# Patient Record
Sex: Female | Born: 1980 | Race: Asian | Hispanic: No | Marital: Married | Smoking: Never smoker
Health system: Southern US, Community
[De-identification: ages and names within clinical notes are randomized; demographics above are authoritative.]

## PROBLEM LIST (undated history)

## (undated) DIAGNOSIS — M069 Rheumatoid arthritis, unspecified: Secondary | ICD-10-CM

---

## 2010-01-03 ENCOUNTER — Encounter: Admission: RE | Admit: 2010-01-03 | Discharge: 2010-01-03 | Payer: Self-pay | Admitting: Gastroenterology

## 2010-12-17 IMAGING — CT CT ABD-PELV W/ CM
3 of 4 series · 12 of 36 positions shown, 19 images · IV contrast (agent unspecified)
Comparison: None.

CLINICAL DATA: Periumbilical abdominal pain.  Nausea.  Bloating.

CT ABDOMEN AND PELVIS WITH CONTRAST
TECHNIQUE: Multidetector CT imaging of the abdomen and pelvis was
performed following the standard protocol during bolus
administration of intravenous contrast.
Contrast: 100 ml Mmnipaque-6XX and oral contrast

[Series 3: routine abdomen · axial · 0.58mm/px · z∈[-325,-5]mm · 9 of 82 slices shown, 15 images]
[im 9/82  soft-tissue]
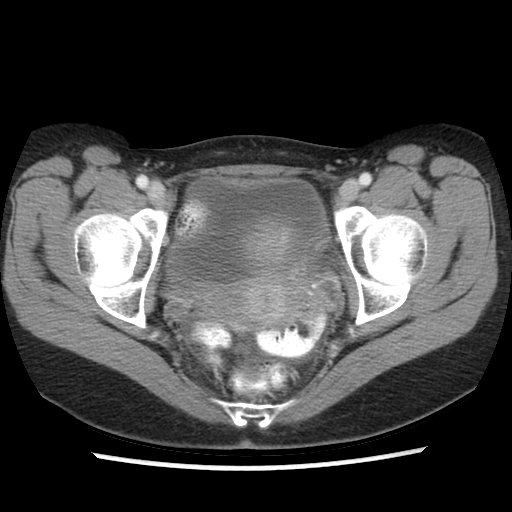
[im 9/82  bone]
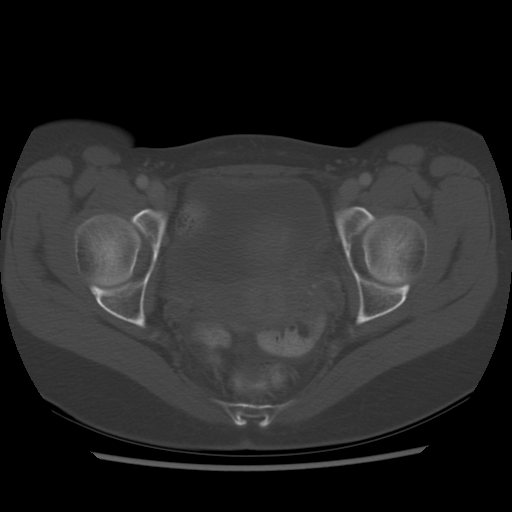
[im 17/82  soft-tissue]
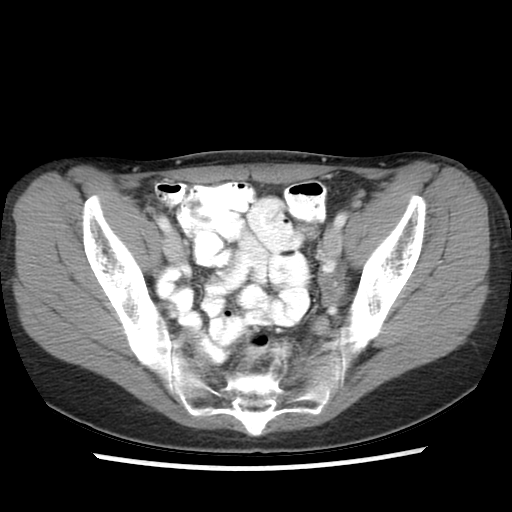
[im 25/82  soft-tissue]
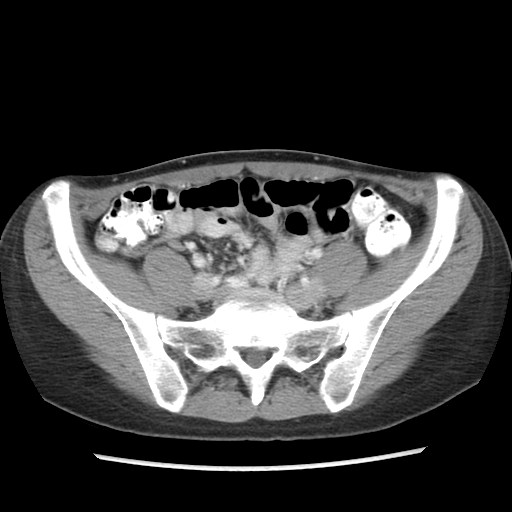
[im 33/82  soft-tissue]
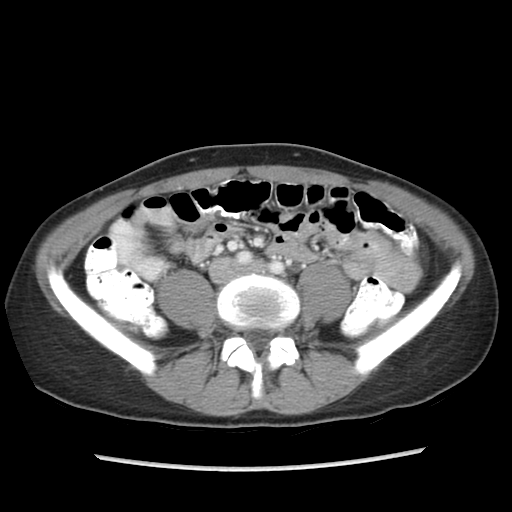
[im 41/82  soft-tissue]
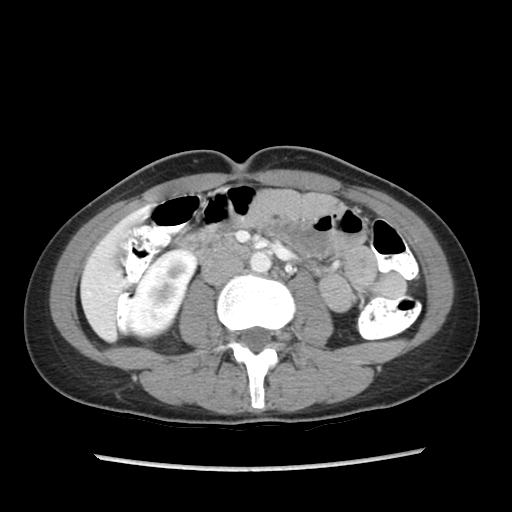
[im 49/82  soft-tissue]
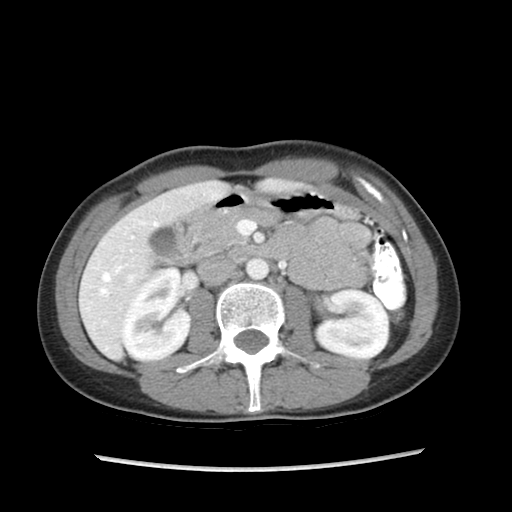
[im 49/82  lung]
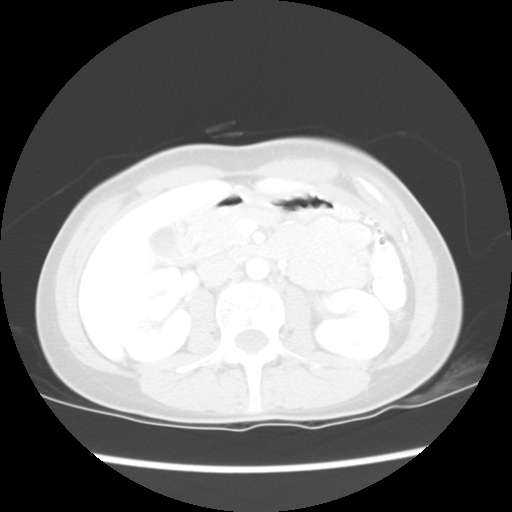
[im 57/82  soft-tissue]
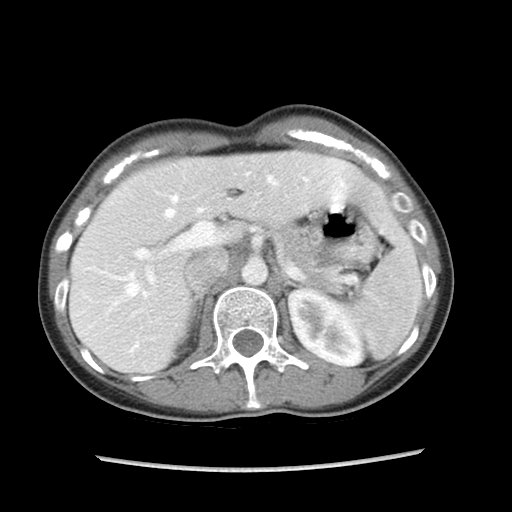
[im 57/82  lung]
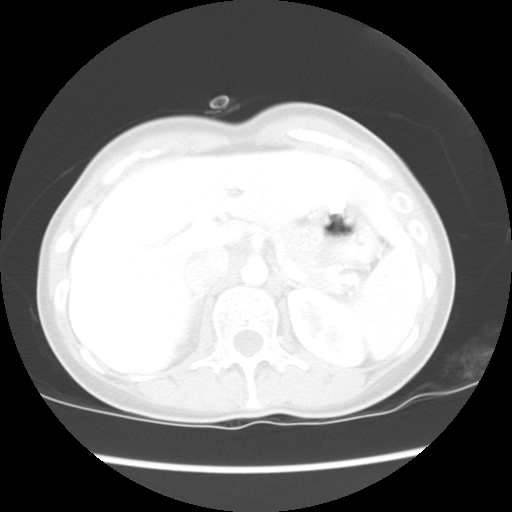
[im 65/82  soft-tissue]
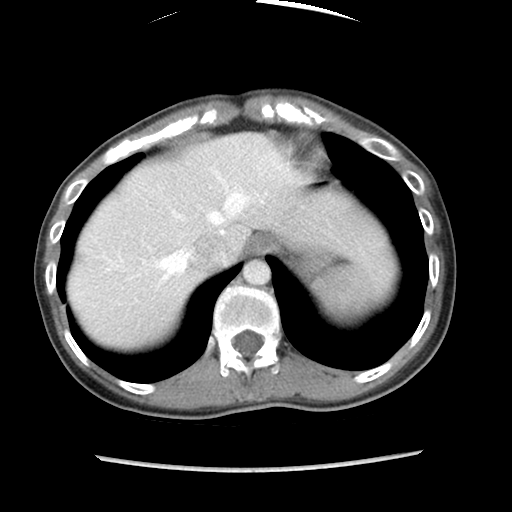
[im 65/82  lung]
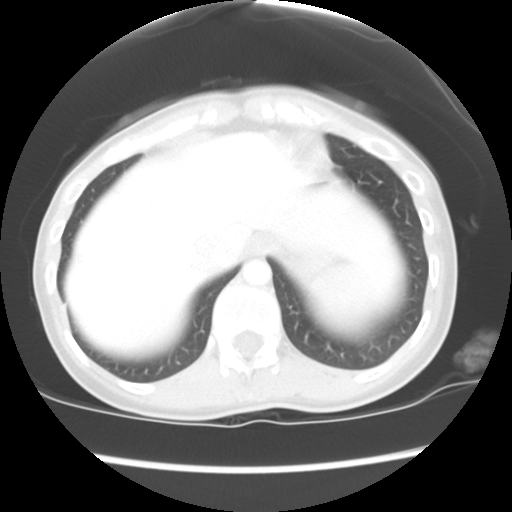
[im 73/82  soft-tissue]
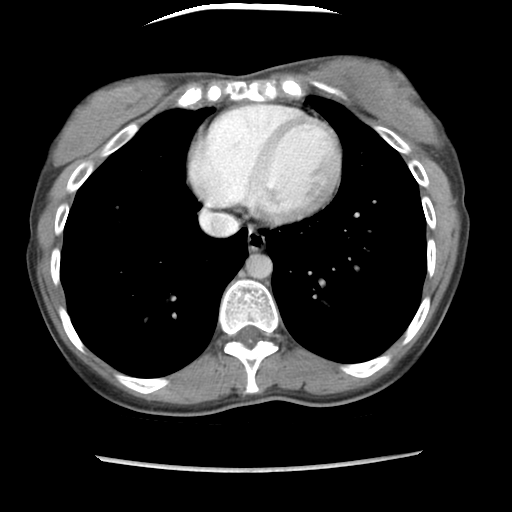
[im 73/82  lung]
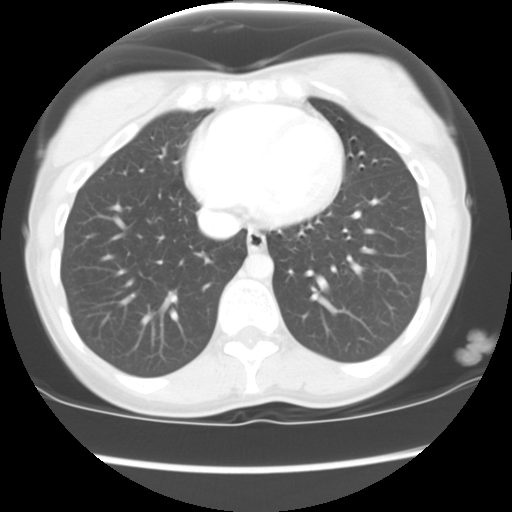
[im 73/82  bone]
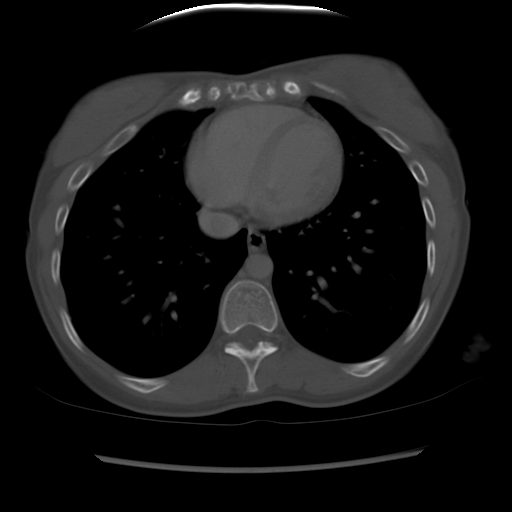

[Series 601: coronal body · coronal · 0.86mm/px · 1 of 108 slices shown, 2 images]
[im 36/108  soft-tissue]
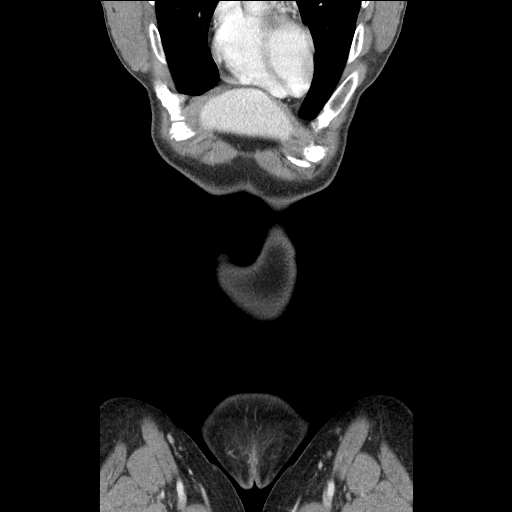
[im 36/108  bone]
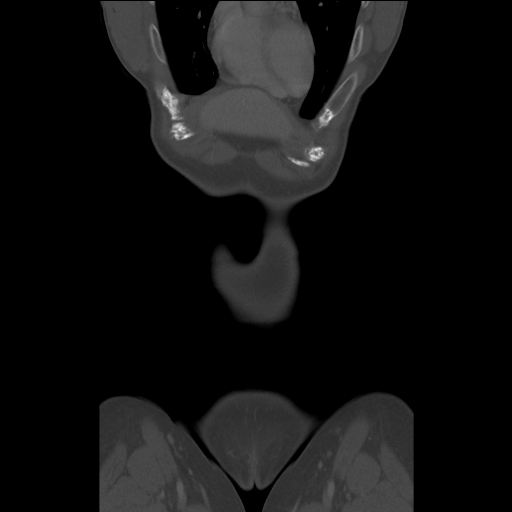

[Series 602: sagittal body · sagittal · 0.86mm/px · 2 of 121 slices shown]
[im 8/121  soft-tissue]
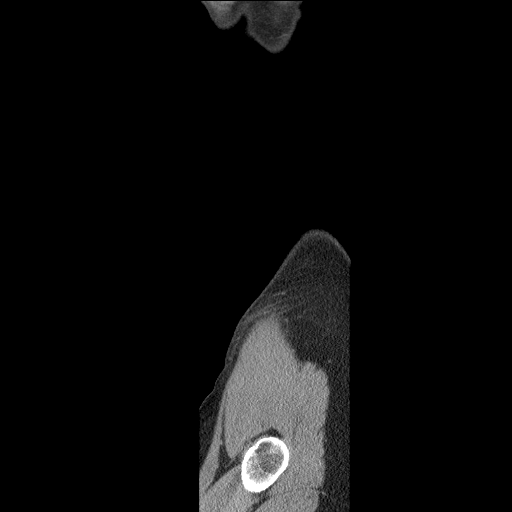
[im 23/121  soft-tissue]
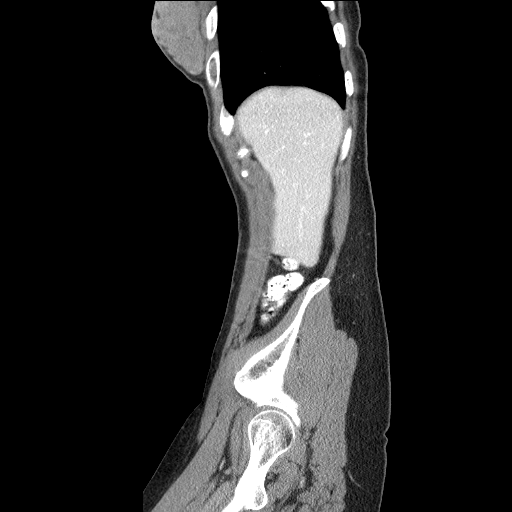

[12 of 36 positions shown; findings below may reference images not displayed]

FINDINGS: The abdominal parenchymal organs are normal in
appearance.  Gallbladder appears normal, and there is no evidence
of hydronephrosis.

No soft tissue masses or lymphadenopathy are identified within the
abdomen or pelvis.  Uterus and adnexa are unremarkable in
appearance.  Incidental note is made of a 1.2 cm follicular cyst in
the left ovary.

There is no evidence of inflammatory process or abnormal fluid
collections within the abdomen or pelvis.  No evidence of bowel
wall thickening or dilatation.  No hernia identified.
IMPRESSION: Negative CT of the abdomen and pelvis.

## 2011-04-05 ENCOUNTER — Inpatient Hospital Stay (HOSPITAL_COMMUNITY)
Admission: AD | Admit: 2011-04-05 | Discharge: 2011-04-05 | Disposition: A | Discharge status: Home / Self Care | Payer: BC Managed Care – PPO | Source: Ambulatory Visit | Attending: Obstetrics and Gynecology | Admitting: Obstetrics and Gynecology

## 2011-04-05 DIAGNOSIS — Z3480 Encounter for supervision of other normal pregnancy, unspecified trimester: Secondary | ICD-10-CM | POA: Insufficient documentation

## 2011-04-05 MED ORDER — GENTAMICIN SULFATE 40 MG/ML IJ SOLN
80.0000 mg | Freq: Once | INTRAMUSCULAR | Status: AC
  Administered 2011-04-05: 80 mg via INTRAMUSCULAR

## 2011-04-07 LAB — RPR: RPR: NONREACTIVE

## 2011-04-07 LAB — ABO/RH

## 2011-04-07 LAB — HIV ANTIBODY (ROUTINE TESTING W REFLEX): HIV: NONREACTIVE

## 2011-04-07 LAB — ANTIBODY SCREEN: Antibody Screen: NEGATIVE

## 2011-10-19 ENCOUNTER — Encounter (HOSPITAL_COMMUNITY): Payer: Self-pay | Admitting: *Deleted

## 2011-10-19 ENCOUNTER — Encounter (HOSPITAL_COMMUNITY): Payer: Self-pay | Admitting: Anesthesiology

## 2011-10-19 ENCOUNTER — Inpatient Hospital Stay (HOSPITAL_COMMUNITY)
Admission: AD | Admit: 2011-10-19 | Discharge: 2011-10-23 | DRG: 370 | Disposition: A | Discharge status: Home / Self Care | Payer: BC Managed Care – PPO | Source: Ambulatory Visit | Attending: Obstetrics & Gynecology | Admitting: Obstetrics & Gynecology

## 2011-10-19 DIAGNOSIS — O324XX0 Maternal care for high head at term, not applicable or unspecified: Secondary | ICD-10-CM | POA: Diagnosis present

## 2011-10-19 DIAGNOSIS — O9903 Anemia complicating the puerperium: Secondary | ICD-10-CM | POA: Diagnosis not present

## 2011-10-19 DIAGNOSIS — D62 Acute posthemorrhagic anemia: Secondary | ICD-10-CM | POA: Diagnosis not present

## 2011-10-19 HISTORY — DX: Rheumatoid arthritis, unspecified: M06.9

## 2011-10-19 LAB — CBC
HCT: 34.1 % — ABNORMAL LOW (ref 36.0–46.0)
Hemoglobin: 11.1 g/dL — ABNORMAL LOW (ref 12.0–15.0)
WBC: 8 10*3/uL (ref 4.0–10.5)

## 2011-10-19 LAB — RPR: RPR Ser Ql: NONREACTIVE

## 2011-10-19 MED ORDER — OXYTOCIN 20 UNITS IN LACTATED RINGERS INFUSION - SIMPLE
INTRAVENOUS | Status: AC
  Administered 2011-10-19: 2 m[IU]/min via INTRAVENOUS
  Filled 2011-10-19: qty 1000

## 2011-10-19 MED ORDER — FENTANYL 2.5 MCG/ML BUPIVACAINE 1/10 % EPIDURAL INFUSION (WH - ANES)
14.0000 mL/h | INTRAMUSCULAR | Status: DC
  Administered 2011-10-19: 14 mL/h via EPIDURAL
  Filled 2011-10-19 (×2): qty 60

## 2011-10-19 MED ORDER — CITRIC ACID-SODIUM CITRATE 334-500 MG/5ML PO SOLN
30.0000 mL | ORAL | Status: DC | PRN
  Administered 2011-10-20: 30 mL via ORAL
  Filled 2011-10-19: qty 15

## 2011-10-19 MED ORDER — OXYCODONE-ACETAMINOPHEN 5-325 MG PO TABS
1.0000 | ORAL_TABLET | ORAL | Status: DC | PRN
Start: 2011-10-19 — End: 2011-10-20

## 2011-10-19 MED ORDER — EPHEDRINE 5 MG/ML INJ
10.0000 mg | INTRAVENOUS | Status: DC | PRN
  Filled 2011-10-19: qty 4

## 2011-10-19 MED ORDER — LACTATED RINGERS IV SOLN
500.0000 mL | Freq: Once | INTRAVENOUS | Status: AC
  Administered 2011-10-19: 500 mL via INTRAVENOUS

## 2011-10-19 MED ORDER — FLEET ENEMA 7-19 GM/118ML RE ENEM: 1.0000 | ENEMA | RECTAL | Status: DC | PRN

## 2011-10-19 MED ORDER — FENTANYL 2.5 MCG/ML BUPIVACAINE 1/10 % EPIDURAL INFUSION (WH - ANES)
INTRAMUSCULAR | Status: DC | PRN
  Administered 2011-10-19: 14 mL/h via EPIDURAL

## 2011-10-19 MED ORDER — DIPHENHYDRAMINE HCL 50 MG/ML IJ SOLN: 12.5000 mg | INTRAMUSCULAR | Status: DC | PRN

## 2011-10-19 MED ORDER — OXYTOCIN 20 UNITS IN LACTATED RINGERS INFUSION - SIMPLE: 125.0000 mL/h | Freq: Once | INTRAVENOUS | Status: DC

## 2011-10-19 MED ORDER — LACTATED RINGERS IV SOLN
INTRAVENOUS | Status: DC
  Administered 2011-10-19: 125 mL/h via INTRAVENOUS
  Administered 2011-10-19 – 2011-10-20 (×2): via INTRAVENOUS

## 2011-10-19 MED ORDER — OXYTOCIN 20 UNITS IN LACTATED RINGERS INFUSION - SIMPLE
1.0000 m[IU]/min | INTRAVENOUS | Status: DC
  Administered 2011-10-19: 2 m[IU]/min via INTRAVENOUS

## 2011-10-19 MED ORDER — IBUPROFEN 600 MG PO TABS: 600.0000 mg | ORAL_TABLET | Freq: Four times a day (QID) | ORAL | Status: DC | PRN

## 2011-10-19 MED ORDER — LIDOCAINE HCL (PF) 1 % IJ SOLN
30.0000 mL | INTRAMUSCULAR | Status: DC | PRN
  Filled 2011-10-19: qty 30

## 2011-10-19 MED ORDER — PHENYLEPHRINE 40 MCG/ML (10ML) SYRINGE FOR IV PUSH (FOR BLOOD PRESSURE SUPPORT): 80.0000 ug | PREFILLED_SYRINGE | INTRAVENOUS | Status: DC | PRN

## 2011-10-19 MED ORDER — OXYTOCIN BOLUS FROM INFUSION
500.0000 mL | Freq: Once | INTRAVENOUS | Status: DC
  Filled 2011-10-19: qty 500

## 2011-10-19 MED ORDER — ACETAMINOPHEN 325 MG PO TABS: 650.0000 mg | ORAL_TABLET | ORAL | Status: DC | PRN

## 2011-10-19 MED ORDER — LACTATED RINGERS IV SOLN: 500.0000 mL | INTRAVENOUS | Status: DC | PRN

## 2011-10-19 MED ORDER — ONDANSETRON HCL 4 MG/2ML IJ SOLN: 4.0000 mg | Freq: Four times a day (QID) | INTRAMUSCULAR | Status: DC | PRN

## 2011-10-19 MED ORDER — TERBUTALINE SULFATE 1 MG/ML IJ SOLN: 0.2500 mg | Freq: Once | INTRAMUSCULAR | Status: AC | PRN

## 2011-10-19 MED ORDER — PHENYLEPHRINE 40 MCG/ML (10ML) SYRINGE FOR IV PUSH (FOR BLOOD PRESSURE SUPPORT)
80.0000 ug | PREFILLED_SYRINGE | INTRAVENOUS | Status: DC | PRN
  Filled 2011-10-19: qty 5

## 2011-10-19 MED ORDER — LIDOCAINE HCL (PF) 1 % IJ SOLN
INTRAMUSCULAR | Status: DC | PRN
  Administered 2011-10-19 (×2): 8 mL

## 2011-10-19 NOTE — MAU Note (Signed)
Pt reports having leaking  On and off since yesterday. Changed 2-3 panty liners and now is wearing a pad. Reports mild ctx 3-4/hr. Reports fetal movement a little less than usual.

## 2011-10-19 NOTE — Anesthesia Procedure Notes (Addendum)
Epidural Patient location during procedure: OB Start time: 10/19/2011 8:39 PM End time: 10/19/2011 8:43 PM Reason for block: procedure for pain  Staffing Anesthesiologist: Sandrea Hughs Performed by: anesthesiologist   Preanesthetic Checklist Completed: patient identified, site marked, surgical consent, pre-op evaluation, timeout performed, IV checked, risks and benefits discussed and monitors and equipment checked  Epidural Patient position: sitting Prep: site prepped and draped and DuraPrep Patient monitoring: continuous pulse ox and blood pressure Approach: midline Injection technique: LOR air  Needle:  Needle type: Tuohy  Needle gauge: 17 G Needle length: 9 cm Needle insertion depth: 5 cm cm Catheter type: closed end flexible Catheter size: 19 Gauge Catheter at skin depth: 10 cm Test dose: negative and Other  Assessment Events: blood not aspirated, injection not painful, no injection resistance, negative IV test and no paresthesia  Epidural Patient location during procedure: OB Start time: 10/20/2011 2:50 AM End time: 10/20/2011 3:00 AM  Staffing Anesthesiologist: Sandrea Hughs Performed by: anesthesiologist   Preanesthetic Checklist Completed: patient identified, site marked, surgical consent, pre-op evaluation, timeout performed, IV checked, risks and benefits discussed and monitors and equipment checked  Epidural Patient position: sitting Prep: site prepped and draped and DuraPrep Patient monitoring: continuous pulse ox and blood pressure Approach: midline Injection technique: LOR air  Needle:  Needle type: Tuohy  Needle gauge: 17 G Needle length: 9 cm Needle insertion depth: 5 cm cm Catheter type: closed end flexible Catheter size: 19 Gauge Catheter at skin depth: 10 cm Test dose: negative and 2% lidocaine with Epi 1:200 K  Assessment Sensory level: T8 Events: blood not aspirated, injection not painful, no injection  resistance, negative IV test and no paresthesia  Additional Notes 1. Prior epidural catheter d/c tip intact after failure to obtain a level > T12. Then procedure repeated.

## 2011-10-19 NOTE — H&P (Signed)
Loretta Mendez is a 31 y.o. female presenting for AOL due to SROM. Clear fluid.  PNC uncomplicated, at [facility], since 8 wks. Pt has RA, stable, not used Enbrel this preg. Took progesterone in 1st trim for low levels. Nl Labs, sono, glucola, but 45 lb wt gain.   Maternal Medical History:  Reason for admission: Reason for admission: rupture of membranes.  Contractions: Onset was 1-2 hours ago.   Frequency: irregular.   Perceived severity is mild.    Fetal activity: Perceived fetal activity is normal.    Prenatal complications: No bleeding, IUGR, pre-eclampsia or preterm labor.     OB History    Grav Para Term Preterm Abortions TAB SAB Ect Mult Living   1 0 0 0 0 0 0 0 0 0      Past Medical History  Diagnosis Date  . Rheumatoid arthritis    No past surgical history on file. Family History: family history is not on file. Social History:  does not have a smoking history on file. She does not have any smokeless tobacco history on file. Her alcohol and drug histories not on file.  Review of Systems  Constitutional: Negative for fever.  Eyes: Negative for blurred vision.  Cardiovascular: Negative for chest pain.  Musculoskeletal: Positive for myalgias.  Neurological: Negative for headaches.     Blood pressure 126/74, pulse 85, temperature 97.7 F (36.5 C), temperature source Oral, resp. rate 18, height 5\' 4"  (1.626 m), weight 161 lb 3.2 oz (73.12 kg). Exam Physical Exam   A&O x 3, no acute distress. Pleasant HEENT neg, no thyromegaly Lungs CTA bilat CV RRR, A1S2 normal Abdo soft, non tender, non acute Extr no edema/ tenderness Pelvic 1/70%/-2/Vtx, SROM, clear fluid FHT  140s/ + accels/ no decels/ mod variab. Category I Toco Irreg   Prenatal labs: ABO, Rh:  B+ Antibody:  neg Rubella:  Imm RPR:   Neg HBsAg:   Neg HIV:   Neg  GBS: Negative (03/20 0000)  Glucola 133 Sono - normal Ultrascreen neg, AFP 1 nl.   Assessment/Plan: 31 yo, G1 at 39.1/7 wks, SROM. Will  augment labor since early labor with rare contractions GBS neg. FWB category I RA stable, no active mngmt.     Emory Leaver R 10/19/2011, 2:10 PM

## 2011-10-19 NOTE — Anesthesia Preprocedure Evaluation (Addendum)
Anesthesia Evaluation  Patient identified by MRN, date of birth, ID band Patient awake    Reviewed: Allergy & Precautions, H&P , NPO status , Patient's Chart, lab work & pertinent test results  Airway Mallampati: I TM Distance: >3 FB Neck ROM: full    Dental No notable dental hx.    Pulmonary neg pulmonary ROS,  breath sounds clear to auscultation  Pulmonary exam normal       Cardiovascular negative cardio ROS      Neuro/Psych negative neurological ROS  negative psych ROS   GI/Hepatic negative GI ROS, Neg liver ROS,   Endo/Other  negative endocrine ROS  Renal/GU negative Renal ROS  negative genitourinary   Musculoskeletal   Abdominal Normal abdominal exam  (+)   Peds negative pediatric ROS (+)  Hematology negative hematology ROS (+)   Anesthesia Other Findings   Reproductive/Obstetrics (+) Pregnancy                           Anesthesia Physical Anesthesia Plan  ASA: II  Anesthesia Plan: Epidural   Post-op Pain Management:    Induction:   Airway Management Planned:   Additional Equipment:   Intra-op Plan:   Post-operative Plan:   Informed Consent: I have reviewed the patients History and Physical, chart, labs and discussed the procedure including the risks, benefits and alternatives for the proposed anesthesia with the patient or authorized representative who has indicated his/her understanding and acceptance.     Plan Discussed with:   Anesthesia Plan Comments:         Anesthesia Quick Evaluation

## 2011-10-20 ENCOUNTER — Inpatient Hospital Stay (HOSPITAL_COMMUNITY): Payer: BC Managed Care – PPO | Admitting: Anesthesiology

## 2011-10-20 ENCOUNTER — Encounter (HOSPITAL_COMMUNITY): Payer: Self-pay | Admitting: Obstetrics

## 2011-10-20 ENCOUNTER — Encounter (HOSPITAL_COMMUNITY)
Admission: AD | Disposition: A | Discharge status: Home / Self Care | Payer: Self-pay | Source: Ambulatory Visit | Attending: Obstetrics & Gynecology

## 2011-10-20 ENCOUNTER — Encounter (HOSPITAL_COMMUNITY): Payer: Self-pay | Admitting: Anesthesiology

## 2011-10-20 SURGERY — Surgical Case
Anesthesia: Epidural | Site: Abdomen | Wound class: Clean Contaminated

## 2011-10-20 MED ORDER — DIPHENHYDRAMINE HCL 50 MG/ML IJ SOLN
12.5000 mg | INTRAMUSCULAR | Status: DC | PRN
  Administered 2011-10-20: 12.5 mg via INTRAVENOUS
  Filled 2011-10-20: qty 1

## 2011-10-20 MED ORDER — LIDOCAINE-EPINEPHRINE (PF) 2 %-1:200000 IJ SOLN
INTRAMUSCULAR | Status: AC
  Filled 2011-10-20: qty 20

## 2011-10-20 MED ORDER — KETOROLAC TROMETHAMINE 30 MG/ML IJ SOLN: 30.0000 mg | Freq: Four times a day (QID) | INTRAMUSCULAR | Status: AC | PRN

## 2011-10-20 MED ORDER — ZOLPIDEM TARTRATE 5 MG PO TABS: 5.0000 mg | ORAL_TABLET | Freq: Every evening | ORAL | Status: DC | PRN

## 2011-10-20 MED ORDER — MEPERIDINE HCL 25 MG/ML IJ SOLN
INTRAMUSCULAR | Status: AC
  Filled 2011-10-20: qty 1

## 2011-10-20 MED ORDER — KETOROLAC TROMETHAMINE 30 MG/ML IJ SOLN
INTRAMUSCULAR | Status: AC
  Filled 2011-10-20: qty 1

## 2011-10-20 MED ORDER — LACTATED RINGERS IV SOLN
INTRAVENOUS | Status: DC
  Administered 2011-10-20: 08:00:00 via INTRAVENOUS

## 2011-10-20 MED ORDER — SODIUM CHLORIDE 0.9 % IV SOLN
1.0000 ug/kg/h | INTRAVENOUS | Status: DC | PRN
  Filled 2011-10-20: qty 2.5

## 2011-10-20 MED ORDER — MEPERIDINE HCL 25 MG/ML IJ SOLN: 6.2500 mg | INTRAMUSCULAR | Status: DC | PRN

## 2011-10-20 MED ORDER — SCOPOLAMINE 1 MG/3DAYS TD PT72
1.0000 | MEDICATED_PATCH | Freq: Once | TRANSDERMAL | Status: DC
  Administered 2011-10-20: 1.5 mg via TRANSDERMAL

## 2011-10-20 MED ORDER — SIMETHICONE 80 MG PO CHEW: 80.0000 mg | CHEWABLE_TABLET | ORAL | Status: DC | PRN

## 2011-10-20 MED ORDER — PHENYLEPHRINE HCL 10 MG/ML IJ SOLN
INTRAMUSCULAR | Status: DC | PRN
  Administered 2011-10-20: 80 ug via INTRAVENOUS
  Administered 2011-10-20: 40 ug via INTRAVENOUS

## 2011-10-20 MED ORDER — KETOROLAC TROMETHAMINE 30 MG/ML IJ SOLN: 15.0000 mg | Freq: Once | INTRAMUSCULAR | Status: DC | PRN

## 2011-10-20 MED ORDER — ONDANSETRON HCL 4 MG/2ML IJ SOLN: 4.0000 mg | Freq: Three times a day (TID) | INTRAMUSCULAR | Status: DC | PRN

## 2011-10-20 MED ORDER — ONDANSETRON HCL 4 MG PO TABS: 4.0000 mg | ORAL_TABLET | ORAL | Status: DC | PRN

## 2011-10-20 MED ORDER — SCOPOLAMINE 1 MG/3DAYS TD PT72
MEDICATED_PATCH | TRANSDERMAL | Status: AC
  Administered 2011-10-20: 1.5 mg via TRANSDERMAL
  Filled 2011-10-20: qty 1

## 2011-10-20 MED ORDER — SODIUM BICARBONATE 8.4 % IV SOLN
INTRAVENOUS | Status: AC
  Filled 2011-10-20: qty 50

## 2011-10-20 MED ORDER — SODIUM BICARBONATE 8.4 % IV SOLN
INTRAVENOUS | Status: DC | PRN
  Administered 2011-10-20: 5 mL via EPIDURAL

## 2011-10-20 MED ORDER — FENTANYL CITRATE 0.05 MG/ML IJ SOLN
INTRAMUSCULAR | Status: DC | PRN
  Administered 2011-10-20: 100 ug via INTRAVENOUS

## 2011-10-20 MED ORDER — MEPERIDINE HCL 25 MG/ML IJ SOLN
INTRAMUSCULAR | Status: DC | PRN
  Administered 2011-10-20: 25 mg via INTRAVENOUS

## 2011-10-20 MED ORDER — MORPHINE SULFATE (PF) 0.5 MG/ML IJ SOLN
INTRAMUSCULAR | Status: DC | PRN
  Administered 2011-10-20: 4 mg via EPIDURAL

## 2011-10-20 MED ORDER — OXYCODONE-ACETAMINOPHEN 5-325 MG PO TABS
1.0000 | ORAL_TABLET | ORAL | Status: DC | PRN
  Administered 2011-10-20 – 2011-10-22 (×9): 1 via ORAL
  Administered 2011-10-23 (×2): 2 via ORAL
  Filled 2011-10-20: qty 1
  Filled 2011-10-20: qty 2
  Filled 2011-10-20 (×8): qty 1
  Filled 2011-10-20: qty 2

## 2011-10-20 MED ORDER — LIDOCAINE-EPINEPHRINE 2 %-1:100000 IJ SOLN
INTRAMUSCULAR | Status: DC | PRN
Start: 2011-10-20 — End: 2011-10-20
  Administered 2011-10-20: 10 mL via INTRADERMAL
  Administered 2011-10-20 (×4): 5 mL via INTRADERMAL

## 2011-10-20 MED ORDER — DIPHENHYDRAMINE HCL 25 MG PO CAPS: 25.0000 mg | ORAL_CAPSULE | ORAL | Status: DC | PRN

## 2011-10-20 MED ORDER — IBUPROFEN 600 MG PO TABS
600.0000 mg | ORAL_TABLET | Freq: Four times a day (QID) | ORAL | Status: DC | PRN
  Filled 2011-10-20 (×7): qty 1

## 2011-10-20 MED ORDER — PRENATAL MULTIVITAMIN CH
1.0000 | ORAL_TABLET | Freq: Every day | ORAL | Status: DC
  Administered 2011-10-21 – 2011-10-23 (×3): 1 via ORAL
  Filled 2011-10-20 (×3): qty 1

## 2011-10-20 MED ORDER — PHENYLEPHRINE 40 MCG/ML (10ML) SYRINGE FOR IV PUSH (FOR BLOOD PRESSURE SUPPORT)
PREFILLED_SYRINGE | INTRAVENOUS | Status: AC
  Filled 2011-10-20: qty 10

## 2011-10-20 MED ORDER — DIPHENHYDRAMINE HCL 25 MG PO CAPS: 25.0000 mg | ORAL_CAPSULE | Freq: Four times a day (QID) | ORAL | Status: DC | PRN

## 2011-10-20 MED ORDER — SIMETHICONE 80 MG PO CHEW
80.0000 mg | CHEWABLE_TABLET | Freq: Three times a day (TID) | ORAL | Status: DC
  Administered 2011-10-20 – 2011-10-23 (×12): 80 mg via ORAL

## 2011-10-20 MED ORDER — CEFAZOLIN SODIUM 1-5 GM-% IV SOLN: 1.0000 g | INTRAVENOUS | Status: DC

## 2011-10-20 MED ORDER — OXYTOCIN 10 UNIT/ML IJ SOLN
INTRAMUSCULAR | Status: AC
  Filled 2011-10-20: qty 4

## 2011-10-20 MED ORDER — DIPHENHYDRAMINE HCL 50 MG/ML IJ SOLN: 25.0000 mg | INTRAMUSCULAR | Status: DC | PRN

## 2011-10-20 MED ORDER — LANOLIN HYDROUS EX OINT: 1.0000 "application " | TOPICAL_OINTMENT | CUTANEOUS | Status: DC | PRN

## 2011-10-20 MED ORDER — CEFAZOLIN SODIUM 1-5 GM-% IV SOLN
INTRAVENOUS | Status: AC
  Filled 2011-10-20: qty 100

## 2011-10-20 MED ORDER — MORPHINE SULFATE (PF) 0.5 MG/ML IJ SOLN
INTRAMUSCULAR | Status: DC | PRN
  Administered 2011-10-20: 1 mg via INTRAVENOUS

## 2011-10-20 MED ORDER — LACTATED RINGERS IV SOLN
INTRAVENOUS | Status: DC | PRN
  Administered 2011-10-20 (×2): via INTRAVENOUS

## 2011-10-20 MED ORDER — NALBUPHINE HCL 10 MG/ML IJ SOLN
5.0000 mg | INTRAMUSCULAR | Status: DC | PRN
  Filled 2011-10-20: qty 1

## 2011-10-20 MED ORDER — WITCH HAZEL-GLYCERIN EX PADS: 1.0000 "application " | MEDICATED_PAD | CUTANEOUS | Status: DC | PRN

## 2011-10-20 MED ORDER — DIBUCAINE 1 % RE OINT: 1.0000 "application " | TOPICAL_OINTMENT | RECTAL | Status: DC | PRN

## 2011-10-20 MED ORDER — TETANUS-DIPHTH-ACELL PERTUSSIS 5-2.5-18.5 LF-MCG/0.5 IM SUSP
0.5000 mL | Freq: Once | INTRAMUSCULAR | Status: AC
  Administered 2011-10-21: 0.5 mL via INTRAMUSCULAR
  Filled 2011-10-20: qty 0.5

## 2011-10-20 MED ORDER — SODIUM CHLORIDE 0.9 % IJ SOLN: 3.0000 mL | INTRAMUSCULAR | Status: DC | PRN

## 2011-10-20 MED ORDER — NALOXONE HCL 0.4 MG/ML IJ SOLN: 0.4000 mg | INTRAMUSCULAR | Status: DC | PRN

## 2011-10-20 MED ORDER — PROMETHAZINE HCL 25 MG/ML IJ SOLN: 6.2500 mg | INTRAMUSCULAR | Status: DC | PRN

## 2011-10-20 MED ORDER — SENNOSIDES-DOCUSATE SODIUM 8.6-50 MG PO TABS
2.0000 | ORAL_TABLET | Freq: Every day | ORAL | Status: DC
  Administered 2011-10-20 – 2011-10-22 (×3): 2 via ORAL

## 2011-10-20 MED ORDER — HYDROMORPHONE HCL PF 1 MG/ML IJ SOLN: 0.2500 mg | INTRAMUSCULAR | Status: DC | PRN

## 2011-10-20 MED ORDER — FENTANYL CITRATE 0.05 MG/ML IJ SOLN
INTRAMUSCULAR | Status: AC
  Filled 2011-10-20: qty 2

## 2011-10-20 MED ORDER — IBUPROFEN 600 MG PO TABS
600.0000 mg | ORAL_TABLET | Freq: Four times a day (QID) | ORAL | Status: DC
  Administered 2011-10-20 – 2011-10-23 (×13): 600 mg via ORAL
  Filled 2011-10-20 (×5): qty 1

## 2011-10-20 MED ORDER — CHLOROPROCAINE HCL 3 % IJ SOLN
INTRAMUSCULAR | Status: AC
  Filled 2011-10-20: qty 20

## 2011-10-20 MED ORDER — KETOROLAC TROMETHAMINE 60 MG/2ML IM SOLN
60.0000 mg | Freq: Once | INTRAMUSCULAR | Status: AC | PRN
  Administered 2011-10-20: 60 mg via INTRAMUSCULAR

## 2011-10-20 MED ORDER — ONDANSETRON HCL 4 MG/2ML IJ SOLN: 4.0000 mg | INTRAMUSCULAR | Status: DC | PRN

## 2011-10-20 MED ORDER — 0.9 % SODIUM CHLORIDE (POUR BTL) OPTIME
TOPICAL | Status: DC | PRN
  Administered 2011-10-20: 400 mL

## 2011-10-20 MED ORDER — LACTATED RINGERS IV SOLN: INTRAVENOUS | Status: DC | PRN

## 2011-10-20 MED ORDER — KETOROLAC TROMETHAMINE 60 MG/2ML IM SOLN
INTRAMUSCULAR | Status: AC
  Administered 2011-10-20: 60 mg via INTRAMUSCULAR
  Filled 2011-10-20: qty 2

## 2011-10-20 MED ORDER — OXYTOCIN 10 UNIT/ML IJ SOLN
20.0000 [IU] | INTRAVENOUS | Status: DC | PRN
  Administered 2011-10-20: 20 [IU] via INTRAVENOUS

## 2011-10-20 MED ORDER — MORPHINE SULFATE 0.5 MG/ML IJ SOLN
INTRAMUSCULAR | Status: AC
  Filled 2011-10-20: qty 10

## 2011-10-20 MED ORDER — CEFAZOLIN SODIUM 1-5 GM-% IV SOLN
INTRAVENOUS | Status: DC | PRN
  Administered 2011-10-20: 2 g via INTRAVENOUS

## 2011-10-20 MED ORDER — ONDANSETRON HCL 4 MG/2ML IJ SOLN
INTRAMUSCULAR | Status: AC
  Filled 2011-10-20: qty 2

## 2011-10-20 MED ORDER — NALBUPHINE HCL 10 MG/ML IJ SOLN
5.0000 mg | INTRAMUSCULAR | Status: DC | PRN
  Administered 2011-10-20: 5 mg via SUBCUTANEOUS
  Filled 2011-10-20 (×2): qty 1

## 2011-10-20 MED ORDER — OXYTOCIN 20 UNITS IN LACTATED RINGERS INFUSION - SIMPLE: 125.0000 mL/h | INTRAVENOUS | Status: AC

## 2011-10-20 MED ORDER — MENTHOL 3 MG MT LOZG: 1.0000 | LOZENGE | OROMUCOSAL | Status: DC | PRN

## 2011-10-20 SURGICAL SUPPLY — 37 items
BENZOIN TINCTURE PRP APPL 2/3 (GAUZE/BANDAGES/DRESSINGS) IMPLANT
CHLORAPREP W/TINT 26ML (MISCELLANEOUS) ×4 IMPLANT
CLOTH BEACON ORANGE TIMEOUT ST (SAFETY) ×2 IMPLANT
CONTAINER PREFILL 10% NBF 15ML (MISCELLANEOUS) IMPLANT
DRESSING TELFA 8X3 (GAUZE/BANDAGES/DRESSINGS) IMPLANT
ELECT REM PT RETURN 9FT ADLT (ELECTROSURGICAL) ×2
ELECTRODE REM PT RTRN 9FT ADLT (ELECTROSURGICAL) ×1 IMPLANT
EXTRACTOR VACUUM KIWI (MISCELLANEOUS) IMPLANT
EXTRACTOR VACUUM M CUP 4 TUBE (SUCTIONS) IMPLANT
GAUZE SPONGE 4X4 12PLY STRL LF (GAUZE/BANDAGES/DRESSINGS) IMPLANT
GLOVE BIO SURGEON STRL SZ7 (GLOVE) ×2 IMPLANT
GLOVE BIOGEL PI IND STRL 7.0 (GLOVE) ×2 IMPLANT
GLOVE BIOGEL PI INDICATOR 7.0 (GLOVE) ×2
GOWN PREVENTION PLUS LG XLONG (DISPOSABLE) ×6 IMPLANT
KIT ABG SYR 3ML LUER SLIP (SYRINGE) IMPLANT
NEEDLE HYPO 25X5/8 SAFETYGLIDE (NEEDLE) IMPLANT
NS IRRIG 1000ML POUR BTL (IV SOLUTION) ×2 IMPLANT
PACK C SECTION WH (CUSTOM PROCEDURE TRAY) ×2 IMPLANT
PAD ABD 7.5X8 STRL (GAUZE/BANDAGES/DRESSINGS) IMPLANT
RTRCTR C-SECT PINK 25CM LRG (MISCELLANEOUS) IMPLANT
SLEEVE SCD COMPRESS KNEE MED (MISCELLANEOUS) ×2 IMPLANT
STAPLER VISISTAT 35W (STAPLE) ×2 IMPLANT
STRIP CLOSURE SKIN 1/4X4 (GAUZE/BANDAGES/DRESSINGS) IMPLANT
SUT MNCRL 0 VIOLET CTX 36 (SUTURE) ×3 IMPLANT
SUT MONOCRYL 0 CTX 36 (SUTURE) ×3
SUT PLAIN 0 NONE (SUTURE) IMPLANT
SUT PLAIN 2 0 (SUTURE)
SUT PLAIN ABS 2-0 CT1 27XMFL (SUTURE) IMPLANT
SUT VIC AB 0 CT1 27 (SUTURE) ×2
SUT VIC AB 0 CT1 27XBRD ANBCTR (SUTURE) ×2 IMPLANT
SUT VIC AB 2-0 CT1 27 (SUTURE) ×2
SUT VIC AB 2-0 CT1 TAPERPNT 27 (SUTURE) ×2 IMPLANT
SUT VIC AB 4-0 KS 27 (SUTURE) IMPLANT
SUT VICRYL 0 TIES 12 18 (SUTURE) IMPLANT
TOWEL OR 17X24 6PK STRL BLUE (TOWEL DISPOSABLE) ×4 IMPLANT
TRAY FOLEY CATH 14FR (SET/KITS/TRAYS/PACK) IMPLANT
WATER STERILE IRR 1000ML POUR (IV SOLUTION) ×2 IMPLANT

## 2011-10-20 NOTE — OR Nursing (Signed)
Fundal massage DLWegner RN 

## 2011-10-20 NOTE — Progress Notes (Signed)
Patient ID: Loretta Mendez, female   DOB: [birth year], 31 y.o.   MRN: [id number] Pt progressed well in labor after admission at 1 cm with SROM. S/p epidural, working well.  Complete at 12 midnight and pushing for over 1.1/2 hrs with no rotation and further descent below 0 to +1 (deep transverse arrest with back to pt's left) and moulding, caput noted.  FHT 155 (changed from 140s), but moderate variability and accels, occ variable decels. VS stable, slight tachy in 100s, with temp at 100.0 Proceed with cesarean delivery for arrest of descent and rotation, OT position.   Risks/complications of surgery reviewed incl infection, bleeding, damage to internal organs including bladder, bowels, ureters, blood vessels, other risks from anesthesia, VTE and delayed complications of any surgery, complications in future surgery reviewed. Also discussed neonatal complications incl difficult delivery, laceration, vacuum assistance, TTN etc. Pt understands and agrees, all concerns addressed.    --V.Shakala Marlatt, MD

## 2011-10-20 NOTE — Transfer of Care (Signed)
Immediate Anesthesia Transfer of Care Note  Patient: Loretta Mendez  Procedure(s) Performed: Procedure(s) (LRB): CESAREAN SECTION (N/A)  Patient Location: PACU  Anesthesia Type: Epidural  Level of Consciousness: awake, alert  and oriented  Airway & Oxygen Therapy: Patient Spontanous Breathing  Post-op Assessment: Report given to PACU RN and Post -op Vital signs reviewed and stable  Post vital signs: stable  Complications: No apparent anesthesia complications

## 2011-10-20 NOTE — Op Note (Signed)
Procedure Note Loretta Mendez, 10/20/2011  Procedure:  Primary Low Transverse Cesarean Section   Pre-operative Diagnosis: Arrest of descent, OP position.   Post-operative Diagnosis: Same, Deep transverse arrest in LOT position  Surgeon:Loretta Kyne [name], MD - Primary   Assistants: None  Anesthesia: Epidural (was replaced in OR since labor epidural didn't give adequate level)  Procedure Details:  The patient was seen in the Labor room and after 2 hrs of pushing, decision was made to proceed with cesarean delivery due to persistent OP position in mid pelvis (+1) with no progress. Hematuria noted.   The risks, benefits, complications, treatment options, and expected outcomes were discussed with the patient. The patient concurred with the proposed plan, giving informed consent. identified as Loretta Mendez and the procedure verified as C-Section Delivery.She was brought to the operating room. A Time Out was held and the above information confirmed.  After induction of anesthesia through labor epidural, the patient was prepped and draped in usual sterile manner. However when tested before starting surgery, she didn't have adequate level for surgery. Since there was no fetal distress, we decided to proceed with repeat epidural rather than general anesthesia. She underwent second epidural without complications and now the level was noted to be adequate for surgery. There was significant delay in starting surgery from the time we came into the room due to this, but FHT was reassuring. Patient was positioned supine with left tilt. She was prepped and draped again in sterile fashion.  A Pfannenstiel incision was made with scalpel and carried down through the subcutaneous tissue to the fascia. Fascial incision was made and extended transversely. The fascia was separated from the underlying rectus tissue superiorly and inferiorly. The peritoneum was identified and entered. Peritoneal incision was extended  longitudinally. The utero-vesical peritoneal reflection was incised transversely and the bladder flap was bluntly freed from the lower uterine segment. A low transverse uterine incision was made. Baby was wedged in the pelvic in LOT/OP position and patient has flat pubic arch, so with some difficulty head rotation and flexion was achieved to complete delivery as vertex. Baby was born at 3.12 am with Apgars 8 and 9. Handed to NICU team after cord was clamped and cut.  Cord ph was not sent.Cord blood was obtained for evaluation. The placenta was removed Intact and appeared normal. Hysterotomy extension noted on left side down to cervix and with careful evaluation the extension edges were grasped with Rings and sutured with 0 Monocryl. The The uterine incision was closed with running locked sutures of 0 Monocryl in 2 layers, first continuos locking and then imbricating. Hemostasis was observed. Lavage was carried out until clear. The tubes and ovaries appeared normal. Peritoneum was closed with 2-0 Vicryl. Pyramidalis approximated.  The fascia was then reapproximated with running sutures of 0Vicryl. The skin was closed with staples. Sterile dressing placed.  Instrument, sponge, and needle counts were correct prior the abdominal closure and were correct at the conclusion of the case.   Findings: Baby BOY was wedged in LOT/OP position. Delivery difficult due to flat pubic symphysis. Delivered as vertex at 3.12 am on 10/20/2011. Apagrs 8 and 9 at and . Weight -pending. Incision extension on left side to the cervix, but repaired with excellent hemostasis. Hematuria cleared at the end of surgery.    Estimated Blood Loss: 700 cc  Total IV Fluids: 2300 cc LR   Urine Output: 150 cc, was bloody at the start and noted to be clearing at the end of  surgery.   Specimens: Cord blood.   Complications: Left side hysterotomy extension toward cervix, sutured with excellent hemostasis.   Disposition: PACU -  hemodynamically stable.   Maternal Condition: stable   Baby condition / location:  With mother.   Attending Attestation: I performed the procedure.   Signed: Surgeon(s): Loretta Fries, MD

## 2011-10-20 NOTE — Progress Notes (Signed)
INTERVAL NOTE:          POD 0  S: Feels tired and itchy, otherwise well. Flatus present, small bleed. Plans BFing, poor latch so far. Infant in nursery at mother's request.   OCeasar Mons Vitals:   10/20/11 0542 10/20/11 0645 10/20/11 0730 10/20/11 0840  BP: 100/59 113/69  95/60  Pulse: 73 82  60  Temp: 99.8 F (37.7 C) 99.3 F (37.4 C)  98.5 F (36.9 C)  TempSrc: Oral Oral  Oral  Resp: 20 20  18   Height:      Weight:      SpO2: 96% 97% 96% 98%  I/O last 3 completed shifts: In: 2500 [I.V.:2500] Out: 2450 [Urine:1050; Blood:1400]     Fundus firm Small lochia Dressing D&I Ext no edema, SCD's on Foley cath to grav - clear yellow urine  A/P:  G1P1001 , s/p 1C/S for failure to descent  Stable post op  Routine orders   Lactation consult today.   Traevon Meiring  10/20/2011 9:05 AM

## 2011-10-20 NOTE — Anesthesia Postprocedure Evaluation (Signed)
Anesthesia Post Note  Patient: Loretta Mendez  Procedure(s) Performed: Procedure(s) (LRB): CESAREAN SECTION (N/A)  Anesthesia type: Epidural  Patient location: PACU  Post pain: Pain level controlled  Post assessment: Post-op Vital signs reviewed  Last Vitals:  Filed Vitals:   10/20/11 0357  BP: 98/59  Pulse: 80  Temp: 37.4 C  Resp: 16    Post vital signs: Reviewed  Level of consciousness: awake  Complications: No apparent anesthesia complications

## 2011-10-21 ENCOUNTER — Encounter (HOSPITAL_COMMUNITY): Payer: Self-pay | Admitting: Obstetrics & Gynecology

## 2011-10-21 LAB — CBC
MCH: 26.4 pg (ref 26.0–34.0)
MCHC: 31.8 g/dL (ref 30.0–36.0)
MCV: 83 fL (ref 78.0–100.0)
Platelets: 150 10*3/uL (ref 150–400)
RDW: 15.4 % (ref 11.5–15.5)
WBC: 12.5 10*3/uL — ABNORMAL HIGH (ref 4.0–10.5)

## 2011-10-21 MED ORDER — POLYSACCHARIDE IRON 150 MG PO CAPS
150.0000 mg | ORAL_CAPSULE | Freq: Every day | ORAL | Status: DC
  Administered 2011-10-21 – 2011-10-23 (×3): 150 mg via ORAL
  Filled 2011-10-21 (×4): qty 1

## 2011-10-21 NOTE — Progress Notes (Signed)
Patient ID: Loretta Mendez, female   DOB: April 14, 1981, 31 y.o.   MRN: [id number] POD # 1  Subjective: Pt reports feeling tired and sore/ Pain controlled with motrin and percocet Tolerating po/ Foley d/c'ed/Voiding without problems/ No n/v/Flatus pos Activity: out of bed and ambulate Bleeding is spotting Newborn info:  Information for the patient's newborn:  [name], [name] [[id number]]  female  / circ declined/ Feeding: breast   Objective: VS: Blood pressure 92/54, pulse 64, temperature 97.3 F (36.3 C), temperature source Oral, resp. rate 18, height 5\' 4"  (1.626 m), weight 73.12 kg (161 lb 3.2 oz), SpO2 98.00%, unknown if currently breastfeeding.    Intake/Output Summary (Last 24 hours) at 10/21/11 0950 Last data filed at 10/20/11 2218  Gross per 24 hour  Intake    750 ml  Output   2900 ml  Net  -2150 ml      Basename 10/21/11 0620 10/19/11 1415  WBC 12.5* 8.0  HGB 9.6* 11.1*  HCT 30.2* 34.1*  PLT 150 200    Blood type: B/Positive/-- (09/17 0000) Rubella: Immune (09/17 0000)    Physical Exam:  General: alert, cooperative and no distress CV: Regular rate and rhythm Resp: clear Abdomen: soft, nontender, normal bowel sounds Incision: covered with dsg; dsg is C/D/I Uterine Fundus: firm, below umbilicus, nontender Lochia: minimal Ext: edema +1 pedal and pretib and Homans sign is negative, no sign of DVT    A/P: POD # 1/ G1P1001 S/P C/Section d/t FTP/primary Mild ABL Anemia Doing well; urged out of bed, shower, and ambulate in halls today. Continue routine post op orders   Signed: Demetrius Revel, MSN, [facility] 10/21/2011, 9:50 AM

## 2011-10-22 LAB — CBC
MCH: 26.3 pg (ref 26.0–34.0)
MCHC: 32.5 g/dL (ref 30.0–36.0)
Platelets: 207 10*3/uL (ref 150–400)
RDW: 15.2 % (ref 11.5–15.5)

## 2011-10-22 MED ORDER — BISACODYL 10 MG RE SUPP
10.0000 mg | Freq: Once | RECTAL | Status: AC
  Administered 2011-10-22: 10 mg via RECTAL
  Filled 2011-10-22: qty 1

## 2011-10-22 NOTE — Progress Notes (Signed)
Telephone call to Dr. Ernestina Penna regarding pt's concern about not defecating and pain related to not defecating.  Pt has ambulated several times today, tolerates reg diet, 4 quad audible bowel sounds, +flatus, soft abd distension. Pt has had hot tea. She refuses percocet for pain.  Husband strongly request that doctor be notified.  Order received for dulcolax suppository. Dr Ernestina Penna will stop by to see pt later this evening.

## 2011-10-22 NOTE — Progress Notes (Signed)
Patient ID: Loretta Mendez, female   DOB: 26-Feb-1981, 31 y.o.   MRN: [id number] POD # 2  Subjective: Pt reports feeling well, some complaints of pain at the right edge of the incision/ Pain controlled with prescription NSAID's including ibuprofen and percocet Tolerating po/Voiding without problems/ No n/v/Flatus yes but no stool yet Activity: out of bed and ambulating without problems Bleeding is moderate Newborn info:  Information for the patient's newborn:  Takeshia, Wenk [[id number]]  female circ declined/ Feeding: breast   Objective: VS: Blood pressure 108/69, pulse 83, temperature 98 F (36.7 C), temperature source Oral, resp. rate 18, height 5\' 4"  (1.626 m), weight 73.12 kg (161 lb 3.2 oz), SpO2 98.00%, unknown if currently breastfeeding.    Physical Exam:  General: alert, cooperative and no distress CV: Regular rate and rhythm Resp: clear Abdomen: soft, nontender, normal bowel sounds Uterine Fundus: firm, below umbilicus, no evidence of bruising, slight tenderness here at the right edge Perineum: no evidence of infection or separation, no oozing Lochia: moderate Ext: edema 1+ in lower extremities. No evidence of a DVT/-Homan's   A/P: POD # 2/ G1P1001/ S/P C/Section d/t arrest of descent and OP position Mild anemia-already on iron supplementation Doing well Continue routine post op orders Anticipate discharge home in the am   SignedLynden Ang [facility] 10/22/2011, 9:47 AM

## 2011-10-22 NOTE — Progress Notes (Signed)
CTSP for abdominal pain and constipation  Pt POD# 2 from [facility] for arrest after 2 hrs pushing. Pt notes nl flatus but no BM. Pt and husband concerned about no BM. Suppository given earlier tonight.  No fevers but pt notes abdominal pain present.  NKDA  Gen: well appearing Inc: C/D/I, staples, small swelling/ edema in skin around incision but well approximated and no bruising Abd: no RUQ pain, uterus at level of umbilicus, tender to palpation,  Appropriate given recent surgery. Skin slightly erythematous- distant from site of incision. No distension, abdomen soft, uterus prominent. LE: 2+ edema  Filed Vitals:   10/21/11 2130 10/22/11 0540 10/22/11 1450 10/22/11 2106  BP: 96/62 108/69 103/65 105/68  Pulse: 73 83 82 77  Temp: 97.7 F (36.5 C) 98 F (36.7 C) 98.1 F (36.7 C) 98 F (36.7 C)  TempSrc: Oral Oral Oral Oral  Resp: 18 18 17 17   Height:      Weight:      SpO2:        A/P:  - constipation. Stool softener now - abdominal pain. Likely routine  Post-op pain combined with poor pain tolerance. Pt is at risk of surgical infection and endometritis given c/s after pushing. Will check CBC, if increased WBC will cont evaluation for possible infection. Overall physical exam non-concerning.  Stclair Szymborski A. 10/22/2011 9:28 PM

## 2011-10-23 MED ORDER — MAGNESIUM HYDROXIDE 400 MG/5ML PO SUSP: 30.0000 mL | Freq: Every day | ORAL | Status: AC | PRN

## 2011-10-23 MED ORDER — IBUPROFEN 600 MG PO TABS: 600.0000 mg | ORAL_TABLET | Freq: Four times a day (QID) | ORAL | Status: AC

## 2011-10-23 MED ORDER — MAGNESIUM HYDROXIDE 400 MG/5ML PO SUSP
30.0000 mL | Freq: Every day | ORAL | Status: DC
  Administered 2011-10-23: 30 mL via ORAL
  Filled 2011-10-23: qty 30

## 2011-10-23 MED ORDER — OXYCODONE-ACETAMINOPHEN 5-325 MG PO TABS: 1.0000 | ORAL_TABLET | ORAL | Status: AC | PRN

## 2011-10-23 NOTE — Discharge Summary (Signed)
POSTOPERATIVE DISCHARGE SUMMARY:  Patient ID: Loretta Mendez MRN: [id number] DOB/AGE: Dec 16, 1980 30 y.o.  Admit date: 10/19/2011 Discharge date:  10/23/2011  Admission Diagnoses: Labor    Discharge Diagnoses:    Term Pregnancy-delivered / POD 3 cesarean section / mild iron deficiency anemia  Prenatal history: G1P1001   EDC : 10/25/2011, by Other Basis  Prenatal care at [facility]  Primary provider : MODY Prenatal course complicated by RA  Prenatal Labs: ABO, Rh: B (09/17 0000) Positive Antibody: Negative (09/17 0000) Rubella: Immune (09/17 0000)  RPR: NON REACTIVE (03/31 1415)  HBsAg: Negative (09/17 0000)  HIV: Non-reactive (09/17 0000)  GBS: Negative (03/20 0000)  1 hr Glucola : nl   Medical / Surgical History :  Past medical history:  Past Medical History  Diagnosis Date  . Rheumatoid arthritis   . PP care - s/p 1C/S 4/1 10/20/2011    Past surgical history:  Past Surgical History  Procedure Date  . No past surgeries   . Cesarean section 10/20/2011    Procedure: CESAREAN SECTION;  Surgeon: Robley Fries, MD;  Location: [facility];  Service: Gynecology;  Laterality: N/A;  primary cesarean section of baby boy  at 0312  APGAR 8/9    Family History: History reviewed. No pertinent family history.  Social History:  reports that she has never smoked. She has never used smokeless tobacco. She reports that she does not drink alcohol or use illicit drugs.   Allergies: Review of patient's allergies indicates no known allergies.    Current Medications at time of admission:  Prior to Admission medications   Medication Sig Start Date End Date Taking? Authorizing Provider  ferrous sulfate 325 (65 FE) MG tablet Take 325 mg by mouth daily with breakfast.   Yes Historical Provider, MD  Prenatal Vit-Fe Fumarate-FA (PRENATAL MULTIVITAMIN) TABS Take 1 tablet by mouth daily.   Yes Historical Provider, MD  ibuprofen (ADVIL,MOTRIN) 600 MG tablet Take 1 tablet (600 mg  total) by mouth every 6 (six) hours. 10/23/11 11/02/11  Marlinda Mike, CNM  magnesium hydroxide (MILK OF MAGNESIA) 400 MG/5ML suspension Take 30 mLs by mouth daily as needed for constipation. 10/23/11 11/02/11  Marlinda Mike, CNM  oxyCODONE-acetaminophen (PERCOCET) 5-325 MG per tablet Take 1 tablet by mouth every 4 (four) hours as needed (moderate - severe pain). 10/23/11 11/02/11  Marlinda Mike, CNM      Intrapartum Course: Admit with onset of labor after SROM / epidural for pain management / deep transverse arrest after ~2 hour second stage  Procedures: Cesarean section delivery of female newborn by Dr Juliene Pina  See operative report for further details  Postoperative / postpartum course: uneventful - mild acute blood loss anemia compounding iron deficiency anemia  Physical Exam:  Normal limits - no dependent edema / no evidence of symptoms DVT  VSS: Blood pressure 97/65, pulse 61, temperature 97.6 F (36.4 C), temperature source Oral, resp. rate 18, height 5\' 4"  (1.626 m), weight 73.12 kg (161 lb 3.2 oz), SpO2 98.00%, unknown if currently breastfeeding.  LABS: WBC/Hgb/Hct/Plts:  9.8/9.0/27.7/207 (04/03 2128)     Incision:  approximated with staples / no erythema / no ecchymosis / no drainage Staples: removed by nurse after shower on day of discharge  Discharge Instructions:  Discharged Condition: stable Activity: pelvic rest Diet: routine Medications: see below Medication List  As of 10/23/2011  9:35 AM   TAKE these medications         ferrous sulfate 325 (65 FE) MG tablet  Take 325 mg by mouth daily with breakfast.      ibuprofen 600 MG tablet   Commonly known as: ADVIL,MOTRIN   Take 1 tablet (600 mg total) by mouth every 6 (six) hours.      magnesium hydroxide 400 MG/5ML suspension   Commonly known as: MILK OF MAGNESIA   Take 30 mLs by mouth daily as needed for constipation.      oxyCODONE-acetaminophen 5-325 MG per tablet   Commonly known as: PERCOCET   Take 1 tablet by mouth every  4 (four) hours as needed (moderate - severe pain).      prenatal multivitamin Tabs   Take 1 tablet by mouth daily.           Condition: stable Postpartum Instructions: refer to practice specific booklet Discharge to: home Disposition: 01-Home or Self Care Follow up :  Follow-up Information    Follow up with MODY,VAISHALI R, MD. Schedule an appointment as soon as possible for a visit in 6 weeks.   Contact information:   [address] [place] [address] [phone]           Signed: Marlinda Mike 10/23/2011, 9:35 AM

## 2011-10-23 NOTE — Progress Notes (Signed)
Patient ID: Loretta Mendez, female   DOB: 11-02-1980, 31 y.o.   MRN: [id number]  POSTOPERATIVE DAY # 3 S/P cesarean section   S:         Reports feeling well - tried - wants to go home             Tolerating po intake / no nausea / no vomiting / + some flatus / no BM             Bleeding is light             Pain controlled with motrin and percocet             Up ad lib / ambulatory  Newborn breast feeding     O:  A & O x 3 NAD             VS: Blood pressure 97/65, pulse 61, temperature 97.6 F (36.4 C), temperature source Oral, resp. rate 18, height 5\' 4"  (1.626 m), weight 73.12 kg (161 lb 3.2 oz), SpO2 98.00%, unknown if currently breastfeeding.  LABS: WBC/Hgb/Hct/Plts:  9.8/9.0/27.7/207 (04/03 2128)   Lungs: Clear and unlabored  Heart: regular rate and rhythm / no mumurs  Abdomen: soft, non-tender, mildly distended , active bowel sounds             Fundus: firm, non-tender, Ueven             Dressing OFF              Incision:  approximated with staples / no erythema / no ecchymosis / no drainage  Perineum: no edema  Lochia: light  Extremities: trace edema, no calf pain or tenderness  A:        POD # 3 S/P cesarean section            Mild iron deficiency anemia            Sluggish bowel motility  P:        Routine postoperative care              Discharge home             MOM daily prn for BM              Iron and magnesium supplement     Marlinda Mike 10/23/2011, 9:27 AM

## 2011-10-24 NOTE — Discharge Summary (Signed)
Reviewed, agree w note and plan. --V.Ahliyah Nienow, MD 

## 2011-11-24 ENCOUNTER — Ambulatory Visit (INDEPENDENT_AMBULATORY_CARE_PROVIDER_SITE_OTHER): Payer: BC Managed Care – PPO | Admitting: Family Medicine

## 2011-11-24 VITALS — BP 101/64 | HR 84 | Temp 98.2°F | Resp 16 | Ht 63.5 in | Wt 136.6 lb

## 2011-11-24 DIAGNOSIS — N390 Urinary tract infection, site not specified: Secondary | ICD-10-CM

## 2011-11-24 DIAGNOSIS — R350 Frequency of micturition: Secondary | ICD-10-CM

## 2011-11-24 DIAGNOSIS — N898 Other specified noninflammatory disorders of vagina: Secondary | ICD-10-CM

## 2011-11-24 LAB — POCT WET PREP WITH KOH
Bacteria Wet Prep HPF POC: NEGATIVE
Epithelial Wet Prep HPF POC: NEGATIVE
KOH Prep POC: NEGATIVE
Yeast Wet Prep HPF POC: NEGATIVE

## 2011-11-24 LAB — POCT UA - MICROSCOPIC ONLY
Crystals, Ur, HPF, POC: NEGATIVE
Epithelial cells, urine per micros: NEGATIVE
Mucus, UA: NEGATIVE
RBC, urine, microscopic: NEGATIVE

## 2011-11-24 LAB — POCT URINALYSIS DIPSTICK
Bilirubin, UA: NEGATIVE
Ketones, UA: NEGATIVE
Protein, UA: NEGATIVE
Spec Grav, UA: <=1.005
pH, UA: 6

## 2011-11-24 MED ORDER — CEPHALEXIN 500 MG PO CAPS
1000.0000 mg | ORAL_CAPSULE | Freq: Two times a day (BID) | ORAL | Status: AC
Start: 2011-11-24 — End: 2011-12-04

## 2011-11-24 NOTE — Progress Notes (Signed)
Patient Name: Loretta Mendez Date of Birth: Sep 22, 1980 Medical Record Number: [id number] Gender: female Date of Encounter: 11/24/2011  History of Present Illness:  Loretta Mendez is a 31 y.o. very pleasant female patient who presents with the following:  She has noted urinary frequency for some time-she thought it was due to her pregnancy but it did not improve after she delivered her baby on 10/20/11. She labored for "a long time" prior to having a C/S.  Over the last week she developed left lower back pain.  No dysuria, vaginal bleeding or hematuria.  She also has noted whitish vaginal discharge for the last 3 or 4 days- some itching as well.    She had a baby boy- he is doing well.  She is breastfeeding- this is going well also.   She has a history of RA but no other major health problems.   Otherwise she feels well, has no fever, nausea or vomiting.   Patient Active Problem List  Diagnoses  . Postpartum care following C/S (4/1)   Past Medical History  Diagnosis Date  . Rheumatoid arthritis   . PP care - s/p 1C/S 4/1 10/20/2011   Past Surgical History  Procedure Date  . No past surgeries   . Cesarean section 10/20/2011    Procedure: CESAREAN SECTION;  Surgeon: Robley Fries, MD;  Location: [facility];  Service: Gynecology;  Laterality: N/A;  primary cesarean section of baby boy  at 0312  APGAR 8/9   History  Substance Use Topics  . Smoking status: Never Smoker   . Smokeless tobacco: Never Used  . Alcohol Use: No   No family history on file. No Known Allergies  Medication list has been reviewed and updated.  Review of Systems: As per HPI- otherwise negative.  Physical Examination: Filed Vitals:   11/24/11 1710  BP: 101/64  Pulse: 84  Temp: 98.2 F (36.8 C)  TempSrc: Oral  Resp: 16  Height: 5' 3.5" (1.613 m)  Weight: 136 lb 9.6 oz (61.961 kg)    Body mass index is 23.82 kg/(m^2).  GEN: WDWN, NAD, Non-toxic, A & O x 3 HEENT: Atraumatic, Normocephalic. Neck supple. No  masses, No LAD. Ears and Nose: No external deformity. CV: RRR, No M/G/R. No JVD. No thrill. No extra heart sounds. PULM: CTA B, no wheezes, crackles, rhonchi. No retractions. No resp. distress. No accessory muscle use. GU: she is still quite sore- did a gentle wet prep, but nothing further today.  No abnormality noted of external genitals. No visible discharge ABD: S, NT, ND, +BS. No rebound. No HSM. Abdomen benign, she does have some left CVA tenderness EXTR: No c/c/e NEURO Normal gait.  PSYCH: Normally interactive. Conversant. Not depressed or anxious appearing.  Calm demeanor.   Results for orders placed in visit on 11/24/11  POCT UA - MICROSCOPIC ONLY      Component Value Range   WBC, Ur, HPF, POC 4-8     RBC, urine, microscopic neg     Bacteria, U Microscopic trace     Mucus, UA neg     Epithelial cells, urine per micros neg     Crystals, Ur, HPF, POC neg     Casts, Ur, LPF, POC neg     Yeast, UA neg    POCT URINALYSIS DIPSTICK      Component Value Range   Color, UA yellow     Clarity, UA clear     Glucose, UA neg     Bilirubin, UA neg  Ketones, UA neg     Spec Grav, UA <=1.005     Blood, UA neg     pH, UA 6.0     Protein, UA neg     Urobilinogen, UA 0.2     Nitrite, UA neg     Leukocytes, UA moderate (2+)    POCT WET PREP WITH KOH      Component Value Range   Trichomonas, UA Negative     Clue Cells Wet Prep HPF POC neg     Epithelial Wet Prep HPF POC neg     Yeast Wet Prep HPF POC neg     Bacteria Wet Prep HPF POC neg     RBC Wet Prep HPF POC neg     WBC Wet Prep HPF POC 3-6     KOH Prep POC Negative       Assessment and Plan: 1. Urinary frequency  POCT UA - Microscopic Only, POCT urinalysis dipstick, cephALEXin (KEFLEX) 500 MG capsule  2. Vaginal Discharge  POCT Wet Prep with KOH  3. UTI (lower urinary tract infection)  Urine culture   Suspect that Morrisa may have a UTI- her urine sample is quite dilute, so culture will also be helpful.  However will go  ahead and treat her with keflex while her culture is pending.  If her urine culture is negative then her back pain may actually be due to her labor.  She has been using some ibuprofen with success for her pain, and may continue to do so.  She will call or RTC/ seek care at ED if any signs of severe illness such as fever, nausea/ vomiting or other worsening symptoms.

## 2011-11-27 LAB — URINE CULTURE

## 2014-02-07 ENCOUNTER — Other Ambulatory Visit: Payer: Self-pay | Admitting: Obstetrics & Gynecology

## 2014-02-21 ENCOUNTER — Inpatient Hospital Stay (HOSPITAL_COMMUNITY): Payer: BC Managed Care – PPO | Admitting: Anesthesiology

## 2014-02-21 ENCOUNTER — Encounter (HOSPITAL_COMMUNITY)
Admission: AD | Disposition: A | Discharge status: Home / Self Care | Payer: Self-pay | Source: Ambulatory Visit | Attending: Obstetrics & Gynecology

## 2014-02-21 ENCOUNTER — Inpatient Hospital Stay (HOSPITAL_COMMUNITY)
Admission: AD | Admit: 2014-02-21 | Discharge: 2014-02-23 | DRG: 766 | Disposition: A | Discharge status: Home / Self Care | Payer: BC Managed Care – PPO | Source: Ambulatory Visit | Attending: Obstetrics & Gynecology | Admitting: Obstetrics & Gynecology

## 2014-02-21 ENCOUNTER — Encounter (HOSPITAL_COMMUNITY): Payer: Self-pay | Admitting: *Deleted

## 2014-02-21 ENCOUNTER — Encounter (HOSPITAL_COMMUNITY): Payer: BC Managed Care – PPO | Admitting: Anesthesiology

## 2014-02-21 DIAGNOSIS — Z98891 History of uterine scar from previous surgery: Secondary | ICD-10-CM

## 2014-02-21 DIAGNOSIS — D62 Acute posthemorrhagic anemia: Secondary | ICD-10-CM | POA: Diagnosis not present

## 2014-02-21 DIAGNOSIS — Z3490 Encounter for supervision of normal pregnancy, unspecified, unspecified trimester: Secondary | ICD-10-CM

## 2014-02-21 DIAGNOSIS — O9902 Anemia complicating childbirth: Secondary | ICD-10-CM | POA: Diagnosis present

## 2014-02-21 DIAGNOSIS — M069 Rheumatoid arthritis, unspecified: Secondary | ICD-10-CM | POA: Diagnosis present

## 2014-02-21 DIAGNOSIS — O34219 Maternal care for unspecified type scar from previous cesarean delivery: Principal | ICD-10-CM | POA: Diagnosis present

## 2014-02-21 DIAGNOSIS — D649 Anemia, unspecified: Secondary | ICD-10-CM | POA: Diagnosis present

## 2014-02-21 DIAGNOSIS — IMO0002 Reserved for concepts with insufficient information to code with codable children: Secondary | ICD-10-CM | POA: Diagnosis present

## 2014-02-21 LAB — CBC
HCT: 36.6 % (ref 36.0–46.0)
Hemoglobin: 12.1 g/dL (ref 12.0–15.0)
MCH: 27.2 pg (ref 26.0–34.0)
MCHC: 33.1 g/dL (ref 30.0–36.0)
MCV: 82.2 fL (ref 78.0–100.0)
PLATELETS: 188 10*3/uL (ref 150–400)
RBC: 4.45 MIL/uL (ref 3.87–5.11)
RDW: 14.1 % (ref 11.5–15.5)
WBC: 7.5 10*3/uL (ref 4.0–10.5)

## 2014-02-21 LAB — TYPE AND SCREEN
ABO/RH(D): B POS
Antibody Screen: NEGATIVE

## 2014-02-21 LAB — OB RESULTS CONSOLE GC/CHLAMYDIA
CHLAMYDIA, DNA PROBE: NEGATIVE
Gonorrhea: NEGATIVE

## 2014-02-21 LAB — OB RESULTS CONSOLE GBS: GBS: NEGATIVE

## 2014-02-21 LAB — ABO/RH: ABO/RH(D): B POS

## 2014-02-21 LAB — SAMPLE TO BLOOD BANK

## 2014-02-21 LAB — RPR

## 2014-02-21 LAB — OB RESULTS CONSOLE HIV ANTIBODY (ROUTINE TESTING): HIV: NONREACTIVE

## 2014-02-21 LAB — OB RESULTS CONSOLE RPR
RPR: NONREACTIVE
RPR: NONREACTIVE

## 2014-02-21 SURGERY — Surgical Case
Anesthesia: Spinal

## 2014-02-21 MED ORDER — FENTANYL CITRATE 0.05 MG/ML IJ SOLN
INTRAMUSCULAR | Status: AC
  Filled 2014-02-21: qty 2

## 2014-02-21 MED ORDER — IBUPROFEN 600 MG PO TABS: 600.0000 mg | ORAL_TABLET | Freq: Four times a day (QID) | ORAL | Status: DC | PRN

## 2014-02-21 MED ORDER — SENNOSIDES-DOCUSATE SODIUM 8.6-50 MG PO TABS
2.0000 | ORAL_TABLET | ORAL | Status: DC
  Administered 2014-02-21 – 2014-02-23 (×2): 2 via ORAL
  Filled 2014-02-21 (×2): qty 2

## 2014-02-21 MED ORDER — EPHEDRINE 5 MG/ML INJ: 10.0000 mg | INTRAVENOUS | Status: DC | PRN

## 2014-02-21 MED ORDER — MEPERIDINE HCL 25 MG/ML IJ SOLN: 6.2500 mg | INTRAMUSCULAR | Status: DC | PRN

## 2014-02-21 MED ORDER — FLEET ENEMA 7-19 GM/118ML RE ENEM: 1.0000 | ENEMA | RECTAL | Status: DC | PRN

## 2014-02-21 MED ORDER — PHENYLEPHRINE 40 MCG/ML (10ML) SYRINGE FOR IV PUSH (FOR BLOOD PRESSURE SUPPORT): 80.0000 ug | PREFILLED_SYRINGE | INTRAVENOUS | Status: DC | PRN

## 2014-02-21 MED ORDER — FENTANYL CITRATE 0.05 MG/ML IJ SOLN
INTRAMUSCULAR | Status: DC | PRN
  Administered 2014-02-21: 10 ug via INTRATHECAL

## 2014-02-21 MED ORDER — ONDANSETRON HCL 4 MG/2ML IJ SOLN: 4.0000 mg | Freq: Four times a day (QID) | INTRAMUSCULAR | Status: DC | PRN

## 2014-02-21 MED ORDER — MENTHOL 3 MG MT LOZG
1.0000 | LOZENGE | OROMUCOSAL | Status: DC | PRN
Start: 2014-02-21 — End: 2014-02-23

## 2014-02-21 MED ORDER — PHENYLEPHRINE 8 MG IN D5W 100 ML (0.08MG/ML) PREMIX OPTIME
INJECTION | INTRAVENOUS | Status: AC
  Filled 2014-02-21: qty 100

## 2014-02-21 MED ORDER — ACETAMINOPHEN 325 MG PO TABS: 650.0000 mg | ORAL_TABLET | ORAL | Status: DC | PRN

## 2014-02-21 MED ORDER — CITRIC ACID-SODIUM CITRATE 334-500 MG/5ML PO SOLN
30.0000 mL | ORAL | Status: DC | PRN
  Administered 2014-02-21: 30 mL via ORAL
  Filled 2014-02-21: qty 15

## 2014-02-21 MED ORDER — DIPHENHYDRAMINE HCL 50 MG/ML IJ SOLN: 12.5000 mg | INTRAMUSCULAR | Status: DC | PRN

## 2014-02-21 MED ORDER — KETOROLAC TROMETHAMINE 30 MG/ML IJ SOLN: 30.0000 mg | Freq: Four times a day (QID) | INTRAMUSCULAR | Status: AC | PRN

## 2014-02-21 MED ORDER — DIBUCAINE 1 % RE OINT: 1.0000 "application " | TOPICAL_OINTMENT | RECTAL | Status: DC | PRN

## 2014-02-21 MED ORDER — ONDANSETRON HCL 4 MG/2ML IJ SOLN: 4.0000 mg | Freq: Three times a day (TID) | INTRAMUSCULAR | Status: DC | PRN

## 2014-02-21 MED ORDER — OXYTOCIN 10 UNIT/ML IJ SOLN
INTRAMUSCULAR | Status: AC
  Filled 2014-02-21: qty 4

## 2014-02-21 MED ORDER — OXYTOCIN 40 UNITS IN LACTATED RINGERS INFUSION - SIMPLE MED
62.5000 mL/h | INTRAVENOUS | Status: DC
  Filled 2014-02-21: qty 1000

## 2014-02-21 MED ORDER — TETANUS-DIPHTH-ACELL PERTUSSIS 5-2.5-18.5 LF-MCG/0.5 IM SUSP: 0.5000 mL | Freq: Once | INTRAMUSCULAR | Status: DC

## 2014-02-21 MED ORDER — WITCH HAZEL-GLYCERIN EX PADS: 1.0000 "application " | MEDICATED_PAD | CUTANEOUS | Status: DC | PRN

## 2014-02-21 MED ORDER — ONDANSETRON HCL 4 MG/2ML IJ SOLN: 4.0000 mg | INTRAMUSCULAR | Status: DC | PRN

## 2014-02-21 MED ORDER — SIMETHICONE 80 MG PO CHEW: 80.0000 mg | CHEWABLE_TABLET | ORAL | Status: DC | PRN

## 2014-02-21 MED ORDER — KETOROLAC TROMETHAMINE 30 MG/ML IJ SOLN: 15.0000 mg | Freq: Once | INTRAMUSCULAR | Status: DC | PRN

## 2014-02-21 MED ORDER — SIMETHICONE 80 MG PO CHEW
80.0000 mg | CHEWABLE_TABLET | Freq: Three times a day (TID) | ORAL | Status: DC
  Administered 2014-02-22 – 2014-02-23 (×4): 80 mg via ORAL
  Filled 2014-02-21 (×4): qty 1

## 2014-02-21 MED ORDER — NALBUPHINE HCL 10 MG/ML IJ SOLN: 5.0000 mg | INTRAMUSCULAR | Status: DC | PRN

## 2014-02-21 MED ORDER — MORPHINE SULFATE (PF) 0.5 MG/ML IJ SOLN
INTRAMUSCULAR | Status: DC | PRN
  Administered 2014-02-21: .2 mg via INTRATHECAL

## 2014-02-21 MED ORDER — LACTATED RINGERS IV SOLN
INTRAVENOUS | Status: DC
  Administered 2014-02-21 (×3): via INTRAVENOUS

## 2014-02-21 MED ORDER — LACTATED RINGERS IV SOLN
INTRAVENOUS | Status: DC
  Administered 2014-02-22: 06:00:00 via INTRAVENOUS

## 2014-02-21 MED ORDER — DIPHENHYDRAMINE HCL 50 MG/ML IJ SOLN
12.5000 mg | INTRAMUSCULAR | Status: DC | PRN
  Administered 2014-02-21: 12.5 mg via INTRAVENOUS

## 2014-02-21 MED ORDER — OXYTOCIN 40 UNITS IN LACTATED RINGERS INFUSION - SIMPLE MED
1.0000 m[IU]/min | INTRAVENOUS | Status: DC
  Administered 2014-02-21: 1 m[IU]/min via INTRAVENOUS

## 2014-02-21 MED ORDER — MORPHINE SULFATE 0.5 MG/ML IJ SOLN
INTRAMUSCULAR | Status: AC
  Filled 2014-02-21: qty 10

## 2014-02-21 MED ORDER — PHENYLEPHRINE 8 MG IN D5W 100 ML (0.08MG/ML) PREMIX OPTIME
INJECTION | INTRAVENOUS | Status: DC | PRN
  Administered 2014-02-21: 60 ug/min via INTRAVENOUS

## 2014-02-21 MED ORDER — METOCLOPRAMIDE HCL 5 MG/ML IJ SOLN: 10.0000 mg | Freq: Three times a day (TID) | INTRAMUSCULAR | Status: DC | PRN

## 2014-02-21 MED ORDER — SODIUM CHLORIDE 0.9 % IJ SOLN: 3.0000 mL | INTRAMUSCULAR | Status: DC | PRN

## 2014-02-21 MED ORDER — FENTANYL 2.5 MCG/ML BUPIVACAINE 1/10 % EPIDURAL INFUSION (WH - ANES): 14.0000 mL/h | INTRAMUSCULAR | Status: DC | PRN

## 2014-02-21 MED ORDER — OXYCODONE-ACETAMINOPHEN 5-325 MG PO TABS: 1.0000 | ORAL_TABLET | ORAL | Status: DC | PRN

## 2014-02-21 MED ORDER — OXYTOCIN BOLUS FROM INFUSION: 500.0000 mL | INTRAVENOUS | Status: DC

## 2014-02-21 MED ORDER — KETOROLAC TROMETHAMINE 30 MG/ML IJ SOLN
INTRAMUSCULAR | Status: AC
  Filled 2014-02-21: qty 1

## 2014-02-21 MED ORDER — EPHEDRINE 5 MG/ML INJ
10.0000 mg | INTRAVENOUS | Status: DC | PRN
  Administered 2014-02-21: 10 mg via INTRAVENOUS

## 2014-02-21 MED ORDER — DIPHENHYDRAMINE HCL 25 MG PO CAPS
25.0000 mg | ORAL_CAPSULE | Freq: Four times a day (QID) | ORAL | Status: DC | PRN
Start: 2014-02-21 — End: 2014-02-23

## 2014-02-21 MED ORDER — SCOPOLAMINE 1 MG/3DAYS TD PT72
1.0000 | MEDICATED_PATCH | Freq: Once | TRANSDERMAL | Status: DC
  Administered 2014-02-21: 1.5 mg via TRANSDERMAL

## 2014-02-21 MED ORDER — BUPIVACAINE IN DEXTROSE 0.75-8.25 % IT SOLN
INTRATHECAL | Status: DC | PRN
  Administered 2014-02-21: 1.4 mL via INTRATHECAL

## 2014-02-21 MED ORDER — OXYTOCIN 10 UNIT/ML IJ SOLN
40.0000 [IU] | INTRAMUSCULAR | Status: DC | PRN
  Administered 2014-02-21: 40 [IU] via INTRAVENOUS

## 2014-02-21 MED ORDER — ONDANSETRON HCL 4 MG PO TABS: 4.0000 mg | ORAL_TABLET | ORAL | Status: DC | PRN

## 2014-02-21 MED ORDER — ONDANSETRON HCL 4 MG/2ML IJ SOLN
INTRAMUSCULAR | Status: AC
  Filled 2014-02-21: qty 2

## 2014-02-21 MED ORDER — DIPHENHYDRAMINE HCL 50 MG/ML IJ SOLN
INTRAMUSCULAR | Status: AC
  Filled 2014-02-21: qty 1

## 2014-02-21 MED ORDER — LANOLIN HYDROUS EX OINT: 1.0000 "application " | TOPICAL_OINTMENT | CUTANEOUS | Status: DC | PRN

## 2014-02-21 MED ORDER — TERBUTALINE SULFATE 1 MG/ML IJ SOLN: 0.2500 mg | Freq: Once | INTRAMUSCULAR | Status: DC | PRN

## 2014-02-21 MED ORDER — PRENATAL MULTIVITAMIN CH
1.0000 | ORAL_TABLET | Freq: Every day | ORAL | Status: DC
  Administered 2014-02-22: 1 via ORAL
  Filled 2014-02-21: qty 1

## 2014-02-21 MED ORDER — SIMETHICONE 80 MG PO CHEW
80.0000 mg | CHEWABLE_TABLET | ORAL | Status: DC
  Administered 2014-02-21 – 2014-02-23 (×2): 80 mg via ORAL
  Filled 2014-02-21 (×2): qty 1

## 2014-02-21 MED ORDER — CEFAZOLIN SODIUM-DEXTROSE 2-3 GM-% IV SOLR
INTRAVENOUS | Status: AC
  Filled 2014-02-21: qty 50

## 2014-02-21 MED ORDER — NALBUPHINE HCL 10 MG/ML IJ SOLN
5.0000 mg | INTRAMUSCULAR | Status: DC | PRN
  Administered 2014-02-21: 10 mg via INTRAVENOUS

## 2014-02-21 MED ORDER — ZOLPIDEM TARTRATE 5 MG PO TABS: 5.0000 mg | ORAL_TABLET | Freq: Every evening | ORAL | Status: DC | PRN

## 2014-02-21 MED ORDER — NALBUPHINE HCL 10 MG/ML IJ SOLN
INTRAMUSCULAR | Status: AC
  Filled 2014-02-21: qty 1

## 2014-02-21 MED ORDER — HYDROMORPHONE HCL PF 1 MG/ML IJ SOLN: 0.2500 mg | INTRAMUSCULAR | Status: DC | PRN

## 2014-02-21 MED ORDER — LIDOCAINE HCL (PF) 1 % IJ SOLN: 30.0000 mL | INTRAMUSCULAR | Status: DC | PRN

## 2014-02-21 MED ORDER — LACTATED RINGERS IV SOLN: 500.0000 mL | INTRAVENOUS | Status: DC | PRN

## 2014-02-21 MED ORDER — PROMETHAZINE HCL 25 MG/ML IJ SOLN: 6.2500 mg | INTRAMUSCULAR | Status: DC | PRN

## 2014-02-21 MED ORDER — LACTATED RINGERS IV SOLN: 500.0000 mL | Freq: Once | INTRAVENOUS | Status: DC

## 2014-02-21 MED ORDER — CEFAZOLIN SODIUM-DEXTROSE 2-3 GM-% IV SOLR
2.0000 g | INTRAVENOUS | Status: AC
  Administered 2014-02-21: 2 g via INTRAVENOUS
  Filled 2014-02-21: qty 50

## 2014-02-21 MED ORDER — NALOXONE HCL 0.4 MG/ML IJ SOLN
0.4000 mg | INTRAMUSCULAR | Status: DC | PRN
Start: 2014-02-21 — End: 2014-02-23

## 2014-02-21 MED ORDER — OXYTOCIN 40 UNITS IN LACTATED RINGERS INFUSION - SIMPLE MED: 62.5000 mL/h | INTRAVENOUS | Status: AC

## 2014-02-21 MED ORDER — NALOXONE HCL 1 MG/ML IJ SOLN: 1.0000 ug/kg/h | INTRAMUSCULAR | Status: DC | PRN

## 2014-02-21 MED ORDER — DIPHENHYDRAMINE HCL 25 MG PO CAPS
25.0000 mg | ORAL_CAPSULE | ORAL | Status: DC | PRN
  Filled 2014-02-21: qty 1

## 2014-02-21 MED ORDER — DIPHENHYDRAMINE HCL 50 MG/ML IJ SOLN: 25.0000 mg | INTRAMUSCULAR | Status: DC | PRN

## 2014-02-21 MED ORDER — KETOROLAC TROMETHAMINE 30 MG/ML IJ SOLN
30.0000 mg | Freq: Four times a day (QID) | INTRAMUSCULAR | Status: AC | PRN
  Administered 2014-02-21: 30 mg via INTRAVENOUS

## 2014-02-21 MED ORDER — ONDANSETRON HCL 4 MG/2ML IJ SOLN
INTRAMUSCULAR | Status: DC | PRN
  Administered 2014-02-21: 4 mg via INTRAVENOUS

## 2014-02-21 MED ORDER — SCOPOLAMINE 1 MG/3DAYS TD PT72
MEDICATED_PATCH | TRANSDERMAL | Status: AC
  Filled 2014-02-21: qty 1

## 2014-02-21 MED ORDER — IBUPROFEN 600 MG PO TABS
600.0000 mg | ORAL_TABLET | Freq: Four times a day (QID) | ORAL | Status: DC
  Administered 2014-02-22 – 2014-02-23 (×6): 600 mg via ORAL
  Filled 2014-02-21 (×6): qty 1

## 2014-02-21 SURGICAL SUPPLY — 40 items
BENZOIN TINCTURE PRP APPL 2/3 (GAUZE/BANDAGES/DRESSINGS) ×3 IMPLANT
BLADE SURG 10 STRL SS (BLADE) ×6 IMPLANT
CLAMP CORD UMBIL (MISCELLANEOUS) IMPLANT
CLOSURE WOUND 1/2 X4 (GAUZE/BANDAGES/DRESSINGS) ×1
CLOTH BEACON ORANGE TIMEOUT ST (SAFETY) ×3 IMPLANT
CONTAINER PREFILL 10% NBF 15ML (MISCELLANEOUS) IMPLANT
DRAPE LG THREE QUARTER DISP (DRAPES) IMPLANT
DRSG OPSITE POSTOP 4X10 (GAUZE/BANDAGES/DRESSINGS) ×3 IMPLANT
DURAPREP 26ML APPLICATOR (WOUND CARE) ×3 IMPLANT
ELECT REM PT RETURN 9FT ADLT (ELECTROSURGICAL) ×3
ELECTRODE REM PT RTRN 9FT ADLT (ELECTROSURGICAL) ×1 IMPLANT
EXTRACTOR VACUUM KIWI (MISCELLANEOUS) IMPLANT
EXTRACTOR VACUUM M CUP 4 TUBE (SUCTIONS) IMPLANT
EXTRACTOR VACUUM M CUP 4' TUBE (SUCTIONS)
GLOVE BIO SURGEON STRL SZ7 (GLOVE) ×3 IMPLANT
GLOVE BIOGEL PI IND STRL 7.0 (GLOVE) ×1 IMPLANT
GLOVE BIOGEL PI INDICATOR 7.0 (GLOVE) ×2
GOWN STRL REUS W/TWL LRG LVL3 (GOWN DISPOSABLE) ×6 IMPLANT
KIT ABG SYR 3ML LUER SLIP (SYRINGE) IMPLANT
NEEDLE HYPO 25X5/8 SAFETYGLIDE (NEEDLE) IMPLANT
NS IRRIG 1000ML POUR BTL (IV SOLUTION) ×3 IMPLANT
PACK C SECTION WH (CUSTOM PROCEDURE TRAY) ×3 IMPLANT
PAD ABD 8X10 STRL (GAUZE/BANDAGES/DRESSINGS) ×3 IMPLANT
PAD OB MATERNITY 4.3X12.25 (PERSONAL CARE ITEMS) ×3 IMPLANT
RTRCTR C-SECT PINK 25CM LRG (MISCELLANEOUS) IMPLANT
STAPLER VISISTAT 35W (STAPLE) IMPLANT
STRIP CLOSURE SKIN 1/2X4 (GAUZE/BANDAGES/DRESSINGS) ×2 IMPLANT
SUT MON AB-0 CT1 36 (SUTURE) ×9 IMPLANT
SUT PLAIN 0 NONE (SUTURE) IMPLANT
SUT PLAIN 2 0 (SUTURE)
SUT PLAIN ABS 2-0 CT1 27XMFL (SUTURE) IMPLANT
SUT VIC AB 0 CT1 27 (SUTURE) ×4
SUT VIC AB 0 CT1 27XBRD ANBCTR (SUTURE) ×2 IMPLANT
SUT VIC AB 2-0 CT1 27 (SUTURE) ×4
SUT VIC AB 2-0 CT1 TAPERPNT 27 (SUTURE) ×2 IMPLANT
SUT VIC AB 4-0 KS 27 (SUTURE) IMPLANT
SUT VICRYL 0 TIES 12 18 (SUTURE) IMPLANT
TOWEL OR 17X24 6PK STRL BLUE (TOWEL DISPOSABLE) ×3 IMPLANT
TRAY FOLEY CATH 14FR (SET/KITS/TRAYS/PACK) IMPLANT
WATER STERILE IRR 1000ML POUR (IV SOLUTION) ×3 IMPLANT

## 2014-02-21 NOTE — Lactation Note (Signed)
This note was copied from the chart of Loretta Mendez. Lactation Consultation Note  Patient Name: Loretta Mendez WIOMB'T Date: 02/21/2014 Reason for consult: Initial assessment;Other (Comment) (LC called to assist in PACU).  Nursery RN had reported that baby was fussy but unable to latch and when she attempted to hand express colostrum, nipple flattened and no colostrum expressed.  PACU RN, Tammy had assisted with latch and states baby able to latch about 4 minutes before my arrival.  Mom breastfed her older son for 1 year but this baby had been fussy and unable to latch after C/S delivery.  Upon LC's arrival in PACU, baby is latched to mom's (L) breast and he slips off briefly, then re-latches easily again and sustains latch with rhythmical sucking bursts for >6 minutes with a few swallows and wide areolar grasp.  LC reviewed hand expression, cue feedings and STS. Mom encouraged to feed baby 8-12 times/24 hours and with feeding cues. LC encouraged review of Baby and Me pp 9, 14 and 20-25 for STS and BF information. LC provided [employer] brochure and reviewed [facility] services and list of community and web site resources.    Maternal Data Formula Feeding for Exclusion: No Has patient been taught Hand Expression?: Yes (LC demonstrated) Does the patient have breastfeeding experience prior to this delivery?: Yes  Feeding    LATCH Score/Interventions            LATCH score (observed) =9/10 as mom only needed assistance briefly, then baby re-latched on his own           Lactation Tools Discussed/Used   STS, cue feedings, hand expression Mom states she used a nipple shield during first few days with her first baby but LC discussed that if baby can latch without NS, that is preferred  Consult Status Consult Status: Follow-up Date: 02/22/14 Follow-up type: In-patient    Warrick Parisian [facility] 02/21/2014, 7:38 PM

## 2014-02-21 NOTE — Progress Notes (Signed)
Patient ID: Loretta Mendez, female   DOB: 1981-01-14, 33 y.o.   MRN: [id number] Pt is getting worse, pt declined VBAC, wants to proceed with R C/section

## 2014-02-21 NOTE — Anesthesia Postprocedure Evaluation (Signed)
Anesthesia Post Note  Patient: Loretta Mendez  Procedure(s) Performed: Procedure(s) (LRB): CESAREAN SECTION (N/A)  Anesthesia type: Spinal  Patient location: PACU  Post pain: Pain level controlled  Post assessment: Post-op Vital signs reviewed  Last Vitals:  Filed Vitals:   02/21/14 1845  BP: 101/71  Pulse: 62  Temp:   Resp: 19    Post vital signs: Reviewed  Level of consciousness: awake  Complications: No apparent anesthesia complications

## 2014-02-21 NOTE — Transfer of Care (Signed)
Immediate Anesthesia Transfer of Care Note  Patient: Loretta Mendez  Procedure(s) Performed: Procedure(s): CESAREAN SECTION (N/A)  Patient Location: PACU  Anesthesia Type:Spinal  Level of Consciousness: awake  Airway & Oxygen Therapy: Patient Spontanous Breathing  Post-op Assessment: Report given to PACU RN  Post vital signs: Reviewed and stable  Complications: No apparent anesthesia complications

## 2014-02-21 NOTE — Op Note (Signed)
Loretta Mendez 02/21/2014 Cesarean Section Procedure Note  Indications: Maternal request for repeat c/section.  38.2 wks G2P1 with prior cesarean delivery (LTCS) for arrest of descent in second stage after 4 hours of pushing. Patient had desired to have repeat c/section in this pregnancy at 39 weeks but presented with ruptured membranes and early labor, so chose to try vaginal trial of birth. After few hours with no further cervical change with pitocin and onset of severe back pain, patient requested to repeat c/section.  Procedure: Repeat Low Transverse Cesarean section.   Pre-operative Diagnosis: Repeat Cesarean section attempt to VBAC.   Post-operative Diagnosis: Same declined VBAC  Surgeon:  Robley Fries, MD    Assistants: Marlinda Mike, CNM  Anesthesia: spinal   Procedure Details:  The patient was seen in the Labor Room. The risks, benefits, complications, treatment options, and expected outcomes were discussed with the patient. The patient concurred with the proposed plan, giving informed consent. identified as Loretta Mendez and the procedure verified as C-Section Delivery.  Patient was brought to the Operating Room. A Time Out was held and the above information confirmed. 2 gm Ancef given. After induction of Spinal anesthesia, the patient was draped and prepped in the usual sterile manner. Foley catheter was placed. A Pfannenstiel Incision was made and carried down through the subcutaneous tissue to the fascia. Fascial incision was made and extended transversely. The fascia was separated from the underlying rectus tissue superiorly and inferiorly. The peritoneum was identified and entered. Peritoneal incision was extended longitudinally. The utero-vesical peritoneal reflection was incised transversely and the bladder flap was bluntly freed from the lower uterine segment. Prior scar was intact.  A low transverse uterine incision was made. Delivered from Cephalic right occiput transverse position  was unengaged FEMALE infant at 5 pm on 02/21/2014 with Apgar scores of 8 at one minute and 9 at five minutes. Baby was handed to NICU team in attendance after clamping and cutting the cord. Cord ph was not sent the umbilical cord blood was obtained for evaluation. The placenta was removed Intact and appeared normal. The uterine outline, tubes and ovaries appeared normal. The uterine incision was closed with running locked sutures of and a second imbricating layer was placed. There were 3 areas of bleeding from the incison, 2 angles and one towards the right of the incision that were secured with figure of 8 sutures. Hemostasis was observed. Lavage was carried out until clear. Peritoneal closure done with 2-0 Vicryl. The fascia was then reapproximated with running sutures of 0Vicryl. The subcuticular closure was performed using 4-0Vicryl. Steristrips and sterile pressure dressings placed.  Instrument, sponge, and needle counts were correct prior the abdominal closure and were correct at the conclusion of the case.   Findings:  Female infant delivered cephalic from [facility] hysterotomy at 5 pm with APGARs 8 adnd 9. Normal tubes and ovaries and uterus. Normal placenta, 3 vessel cord.    Estimated Blood Loss: 600 cc   Total IV Fluids: 2100 cc LR  Urine Output: 400 CC OF clear urine  Specimens: cord blood   Complications: no complications  Disposition: PACU - hemodynamically stable.   Maternal Condition: stable   Baby condition / location:  Couplet care / Skin to Skin  Attending Attestation: I performed the procedure.   Signed: Surgeon(s): Robley Fries, MD

## 2014-02-21 NOTE — H&P (Addendum)
Loretta Mendez is a 33 y.o. female presenting with SROM at 6 am this morning, admitted from office. UCs every 3 min, have not spaced out.Good FMs, slight spotting of blood noted.  [facility], Dr Juliene Pina primary, started at 8 wks, uncomplicated.  Has RA, didn't take Embrel this pregnancy, some pain, overall stable.  52 lbs wt gain, no GDM. Baby with marginal cord, normal interval growth at 28 wks- 3'3" at 92%. Increased NT in 2nd trim at 19 wks but normal cardiac sono findings and has negative ultrascreen and NT.    Prior LTCS for arrest of descent, pushed for several hours. Pt was planning to have rpt C/s and wants VBAC since in labor today.   History OB History   Grav Para Term Preterm Abortions TAB SAB Ect Mult Living   2 1 1  0 0 0 0 0 0 1     Past Medical History  Diagnosis Date  . Rheumatoid arthritis(714.0)   . PP care - s/p 1C/S 4/1 10/20/2011   Past Surgical History  Procedure Laterality Date  . Cesarean section  10/20/2011    Procedure: CESAREAN SECTION;  Surgeon: 12/20/2011, MD;  Location: [facility];  Service: Gynecology;  Laterality: N/A;  primary cesarean section of baby boy  at 0312  APGAR 8/9   Family History: family history is not on file. Social History:  reports that she has never smoked. She has never used smokeless tobacco. She reports that she does not drink alcohol or use illicit drugs.   Prenatal Transfer Tool  Maternal Diabetes: No Genetic Screening: Normal Ultrascreen negative. AFP1 normal  Maternal Ultrasounds/Referrals: Normal- except increased NT at anatomy sono, nl NT in 1st trim and cardaic sono normal (hence deferred Fetal echo). Fetal Ultrasounds or other Referrals:  None Maternal Substance Abuse:  No Significant Maternal Medications:  None Significant Maternal Lab Results:  Lab values include: Group B Strep negative Other Comments:  None  ROS  Neg      Blood pressure 114/79, pulse 81, temperature 97.3 F (36.3 C), temperature source Oral,  resp. rate 20, height 5\' 4"  (1.626 m), weight 164 lb (74.39 kg), last menstrual period 06/08/2013, currently breastfeeding. Exam Physical Exam  A&O x 3, no acute distress. Pleasant HEENT neg, no thyromegaly Lungs CTA bilat CV RRR, S1S2 normal Abdo soft, non tender, non acute Extr no edema/ tenderness Pelvic 3/50%/-4/VTX, clear copious fluid.  FHT  130/ + accels/ no decels/ mod variab- category I Toco irreg   Prenatal labs: ABO, Rh:  B positive Antibody:  negative Rubella:  Immune RPR: Nonreactive, Nonreactive (08/04 0925)  HBsAg:   neg  HIV: Non-reactive (08/04 0925)  GBS: Negative (08/04 0925)  Ultrascreen neg, AFP1 normal Glucola 111 Anemia- mild  Assessment/Plan: 33 yo, G2P1001 at 38.2 wks, early labor, SROM. Start pitocin at low dose and assess progress. At risk for repeat C/s, understands TOLAC risk of uterine scar rupture at 1-2%. EFW 6.1/2  lbs.    Annise Boran R 02/21/2014, 1:27 PM

## 2014-02-21 NOTE — Anesthesia Procedure Notes (Signed)
Spinal  Patient location during procedure: OR Start time: 02/21/2014 4:35 PM End time: 02/21/2014 4:38 PM Staffing Anesthesiologist: Leilani Able Performed by: anesthesiologist  Preanesthetic Checklist Completed: patient identified, surgical consent, pre-op evaluation, timeout performed, IV checked, risks and benefits discussed and monitors and equipment checked Spinal Block Patient position: sitting Prep: site prepped and draped and DuraPrep Patient monitoring: heart rate, cardiac monitor, continuous pulse ox and blood pressure Approach: midline Location: L3-4 Injection technique: single-shot Needle Needle type: Pencan  Needle gauge: 24 G Needle length: 9 cm Needle insertion depth: 6 cm Assessment Sensory level: T4

## 2014-02-21 NOTE — Anesthesia Preprocedure Evaluation (Addendum)
Anesthesia Evaluation  Patient identified by MRN, date of birth, ID band Patient awake    Reviewed: Allergy & Precautions, H&P , NPO status , Patient's Chart, lab work & pertinent test results  Airway Mallampati: I  TM Distance: >3 FB Neck ROM: full    Dental no notable dental hx.    Pulmonary neg pulmonary ROS,    Pulmonary exam normal       Cardiovascular negative cardio ROS      Neuro/Psych negative neurological ROS  negative psych ROS   GI/Hepatic negative GI ROS, Neg liver ROS,   Endo/Other  negative endocrine ROS  Renal/GU negative Renal ROS     Musculoskeletal   Abdominal Normal abdominal exam  (+)   Peds  Hematology negative hematology ROS (+)   Anesthesia Other Findings   Reproductive/Obstetrics (+) Pregnancy                             Anesthesia Physical Anesthesia Plan  ASA: II  Anesthesia Plan: Spinal   Post-op Pain Management:    Induction:   Airway Management Planned:   Additional Equipment:   Intra-op Plan:   Post-operative Plan:   Informed Consent: I have reviewed the patients History and Physical, chart, labs and discussed the procedure including the risks, benefits and alternatives for the proposed anesthesia with the patient or authorized representative who has indicated his/her understanding and acceptance.     Plan Discussed with: CRNA and Surgeon  Anesthesia Plan Comments:         Anesthesia Quick Evaluation  

## 2014-02-22 ENCOUNTER — Encounter (HOSPITAL_COMMUNITY): Payer: Self-pay | Admitting: Obstetrics & Gynecology

## 2014-02-22 LAB — CBC
HEMATOCRIT: 30.3 % — AB (ref 36.0–46.0)
Hemoglobin: 9.9 g/dL — ABNORMAL LOW (ref 12.0–15.0)
MCH: 26.7 pg (ref 26.0–34.0)
MCHC: 32.7 g/dL (ref 30.0–36.0)
MCV: 81.7 fL (ref 78.0–100.0)
Platelets: 144 10*3/uL — ABNORMAL LOW (ref 150–400)
RBC: 3.71 MIL/uL — ABNORMAL LOW (ref 3.87–5.11)
RDW: 14.1 % (ref 11.5–15.5)
WBC: 8.7 10*3/uL (ref 4.0–10.5)

## 2014-02-22 MED ORDER — MAGNESIUM OXIDE 400 (241.3 MG) MG PO TABS
400.0000 mg | ORAL_TABLET | Freq: Every day | ORAL | Status: DC
  Administered 2014-02-22: 400 mg via ORAL
  Filled 2014-02-22 (×2): qty 1

## 2014-02-22 MED ORDER — POLYSACCHARIDE IRON COMPLEX 150 MG PO CAPS
150.0000 mg | ORAL_CAPSULE | Freq: Two times a day (BID) | ORAL | Status: DC
  Administered 2014-02-22 – 2014-02-23 (×3): 150 mg via ORAL
  Filled 2014-02-22 (×3): qty 1

## 2014-02-22 NOTE — Lactation Note (Signed)
This note was copied from the chart of [name]. Lactation Consultation Note: Called to assist mom with latch. Reports that baby last fed 2 hours ago. Unwrapped baby and he started fussing. Used football hold on right breast. Mom reports that she has had trouble getting him to latch to right breast- RN used NS earlier. Assisted with latch and baby latched well and nursed with some stimulation needed to keep him awake. Encouraged mom to keep him skin to skin. and watch for feeding cues.  Reports he is latching better to left breast. Nipples are sore- slightly raw on tip. Encouraged to wait for wide open mouth and keep the baby close to the breast throughout the feeding. Comfort gels given with instructions.No questions at present. To call for assist prn.  Patient Name: [name] Date: 02/22/2014 Reason for consult: Follow-up assessment   Maternal Data Formula Feeding for Exclusion: No  Feeding Feeding Type: Breast Fed Length of feed: 10 min  LATCH Score/Interventions Latch: Grasps breast easily, tongue down, lips flanged, rhythmical sucking.  Audible Swallowing: A few with stimulation  Type of Nipple: Everted at rest and after stimulation  Comfort (Breast/Nipple): Soft / non-tender     Hold (Positioning): Assistance needed to correctly position infant at breast and maintain latch. Intervention(s): Breastfeeding basics reviewed  LATCH Score: 8  Lactation Tools Discussed/Used     Consult Status Consult Status: Follow-up Date: 02/23/14 Follow-up type: In-patient    Pamelia Hoit 02/22/2014, 1:32 PM

## 2014-02-22 NOTE — Progress Notes (Signed)
POD # 1  Subjective: Pt reports feeling well/ Pain controlled with Motrin and long-acting narcotic Tolerating po/ Foley d/c'd and due to void/ No n/v/ Flatus present Activity: up with assistance Bleeding is light Newborn info:  Information for the patient's newborn:  Larine, Fielding [[id number]]  female  / Circumcision: not planning/ Feeding: breast and bottle   Objective:  VS:  Filed Vitals:   02/22/14 0230 02/22/14 0235 02/22/14 0433 02/22/14 0830  BP: 107/58 130/61 118/68 96/55  Pulse: 66 68 62 62  Temp:   98 F (36.7 C) 98.2 F (36.8 C)  TempSrc:   Oral Oral  Resp:   18 20  Height:      Weight:      SpO2:   98% 99%     I&O: Intake/Output     08/04 0701 - 08/05 0700 08/05 0701 - 08/06 0700   I.V. (mL/kg) 2100 (28.2)    Total Intake(mL/kg) 2100 (28.2)    Urine (mL/kg/hr) 1150 600 (3.1)   Blood 600    Total Output 1750 600   Net +350 -600           Recent Labs  02/21/14 0936 02/22/14 0549  WBC 7.5 8.7  HGB 12.1 9.9*  HCT 36.6 30.3*  PLT 188 144*    Blood type: --/--/B POS, B POS (08/04 0940) Rubella:      Physical Exam:  General: alert and cooperative CV: Regular rate and rhythm Resp: CTA bilaterally Abdomen: soft, nontender, normal bowel sounds Incision: pressure dsg c/d/i Uterine Fundus: firm, below umbilicus, nontender Lochia: minimal Ext: extremities normal, atraumatic, no cyanosis or edema and Homans sign is negative, no sign of DVT    Assessment: POD # 1/ G2P2002/ S/P C/Section d/t repeat ABL anemia Doing well  Plan: Ambulate Continue routine post op orders   Signed: Donette Larry, N, MSN, CNM 02/22/2014, 9:38 AM

## 2014-02-22 NOTE — Addendum Note (Signed)
Addendum created 02/22/14 0748 by Orlie Pollen, CRNA   Modules edited: Notes Section   Notes Section:  File: [id number]

## 2014-02-22 NOTE — Lactation Note (Signed)
This note was copied from the chart of Loretta Mendez. Lactation Consultation Note  Follow up visit at 29 hours of age.  Mom reports baby is still not latching well to right breast.  Right nipple is only semi-compressible and looks edematous, but mom reports this is her normal.  Left nipple is more compressible.  Hand pump used on right nipple to help evert nipple.  Colostrum quickly leaking and nipple everts.  Tip of nipple blanches white with pumping, but is not causing friction on flange and appears to be a good size fit for mom.  Offered inverted nipple shells to use as an option, but mom has comfort gels so I encouraged her to use those to help with soreness.  Mom has blisters on the tip of both nipples.  Encouraged mom to call [facility] RN for latch score tonight.      Patient Name: Loretta Telesa Jeancharles ZDGLO'V Date: 02/22/2014 Reason for consult: Follow-up assessment   Maternal Data    Feeding Feeding Type: Breast Fed Length of feed: 15 min  LATCH Score/Interventions Latch: Repeated attempts needed to sustain latch, nipple held in mouth throughout feeding, stimulation needed to elicit sucking reflex. Intervention(s): Adjust position;Assist with latch  Audible Swallowing: A few with stimulation Intervention(s): Skin to skin;Hand expression  Type of Nipple: Everted at rest and after stimulation  Comfort (Breast/Nipple): Soft / non-tender     Hold (Positioning): Assistance needed to correctly position infant at breast and maintain latch. Intervention(s): Breastfeeding basics reviewed  LATCH Score: 7  Lactation Tools Discussed/Used Date initiated:: 02/23/14   Consult Status Consult Status: Follow-up Date: 02/23/14 Follow-up type: In-patient    Derwin Reddy, Arvella Merles 02/22/2014, 10:01 PM

## 2014-02-22 NOTE — Anesthesia Postprocedure Evaluation (Signed)
  Anesthesia Post-op Note  Patient: Loretta Mendez  Procedure(s) Performed: Procedure(s): CESAREAN SECTION (N/A)  Patient Location: PACU and Mother/Baby  Anesthesia Type:Spinal  Level of Consciousness: awake, alert , oriented and patient cooperative  Airway and Oxygen Therapy: Patient Spontanous Breathing  Post-op Pain: none  Post-op Assessment: Post-op Vital signs reviewed, Patient's Cardiovascular Status Stable, Respiratory Function Stable, Patent Airway, No signs of Nausea or vomiting, Adequate PO intake, Pain level controlled, No headache, No backache, No residual numbness and No residual motor weakness  Post-op Vital Signs: Reviewed and stable  Last Vitals:  Filed Vitals:   02/22/14 0433  BP: 118/68  Pulse: 62  Temp: 36.7 C  Resp: 18    Complications: No apparent anesthesia complications

## 2014-02-23 DIAGNOSIS — D62 Acute posthemorrhagic anemia: Secondary | ICD-10-CM | POA: Diagnosis not present

## 2014-02-23 MED ORDER — OXYCODONE-ACETAMINOPHEN 5-325 MG PO TABS: 1.0000 | ORAL_TABLET | ORAL | Status: AC | PRN

## 2014-02-23 MED ORDER — IBUPROFEN 600 MG PO TABS: 600.0000 mg | ORAL_TABLET | Freq: Four times a day (QID) | ORAL | Status: AC

## 2014-02-23 MED ORDER — MAGNESIUM OXIDE 400 (241.3 MG) MG PO TABS: 400.0000 mg | ORAL_TABLET | Freq: Every day | ORAL | Status: AC

## 2014-02-23 MED ORDER — POLYSACCHARIDE IRON COMPLEX 150 MG PO CAPS: 150.0000 mg | ORAL_CAPSULE | Freq: Two times a day (BID) | ORAL | Status: AC

## 2014-02-23 NOTE — Discharge Summary (Signed)
POSTOPERATIVE DISCHARGE SUMMARY:  Patient ID: Loretta Mendez MRN: [id number] DOB/AGE: Dec 16, 1980 33 y.o.  Admit date: 02/21/2014 Admission Diagnoses: 38.[redacted] weeks gestation, SROM, previous cesarean section, for TOLAC   Discharge date: 02/23/2014 Discharge Diagnoses: S/P Repeat C/S on 02/21/14, ABL anemia        Prenatal history: G2P2002   EDC : 03/05/2014, by LMP  Has received prenatal care at [facility] since [redacted] wks gestation. Primary provider : Dr. Juliene Pina Prenatal course complicated by h/o RA-no meds during pregnancy, marginal cord insertion with normal growth, and excess weight gain.  Prenatal labs: ABO, Rh: --/--/B POS, B POS (08/04 0940) Antibody: NEG (08/04 0940) Rubella:   / Immune RPR: NON REAC (08/04 0936)  HBsAg:   Neg HIV: Non-reactive (08/04 0925)  GBS: Negative (08/04 0925)  GTT: 111  Medical / Surgical History :  Past medical history:  Past Medical History  Diagnosis Date  . Rheumatoid arthritis(714.0)   . PP care - s/p 1C/S 4/1 10/20/2011    Past surgical history:  Past Surgical History  Procedure Laterality Date  . Cesarean section  10/20/2011    Procedure: CESAREAN SECTION;  Surgeon: Robley Fries, MD;  Location: [facility];  Service: Gynecology;  Laterality: N/A;  primary cesarean section of baby boy  at 0312  APGAR 8/9  . Cesarean section N/A 02/21/2014    Procedure: CESAREAN SECTION;  Surgeon: Robley Fries, MD;  Location: [facility];  Service: Obstetrics;  Laterality: N/A;     Medications on Admission: Prescriptions prior to admission  Medication Sig Dispense Refill  . ferrous sulfate 325 (65 FE) MG tablet Take 325 mg by mouth daily with breakfast.      . Prenatal Vit-Fe Fumarate-FA (PRENATAL MULTIVITAMIN) TABS Take 1 tablet by mouth daily.      . Fluconazole (DIFLUCAN PO) Take 1 tablet by mouth daily.        Allergies: Review of patient's allergies indicates no known allergies.   Intrapartum Course:  Admitted for TOLAC, maternal  intolerance of labor, repeat CS per pt request.  Postpartum Course: Complicated by ABL anemia.  Physical Exam:   VSS: Blood pressure 102/63, pulse 55, temperature 98.4 F (36.9 C), temperature source Oral, resp. rate 20, height 5\' 4"  (1.626 m), weight 74.39 kg (164 lb), last menstrual period 06/08/2013, SpO2 100.00%, unknown if currently breastfeeding.  LABS:  Recent Labs  02/21/14 0936 02/22/14 0549  WBC 7.5 8.7  HGB 12.1 9.9*  PLT 188 144*    General: Alert and oriented x3 Heart: RRR Lungs: CTA bilaterally GI: soft, non-tender, non-distended, BS x4 Lochia: small Uterus: firm below umbilicus Incision: well approximated, honeycomb dressing, no significant erythema, drainage, or edema Extremities: trace edema, Homans neg   Newborn Data Live born female  Birth Weight: 5 lb 15.8 oz (2716 g) APGAR: 8,   See operative report for further details  Home with mother.  Discharge Instructions:  Wound Care: keep clean and dry / remove honeycomb POD 5 Postpartum Instructions: [facility] discharge booklet - instructions reviewed Medications:    Medication List    STOP taking these medications       DIFLUCAN PO     ferrous sulfate 325 (65 FE) MG tablet      TAKE these medications       ibuprofen 600 MG tablet  Commonly known as:  ADVIL,MOTRIN  Take 1 tablet (600 mg total) by mouth every 6 (six) hours.     iron polysaccharides 150 MG capsule  Commonly known  as:  NIFEREX  Take 1 capsule (150 mg total) by mouth 2 (two) times daily.     magnesium oxide 400 (241.3 MG) MG tablet  Commonly known as:  MAG-OX  Take 1 tablet (400 mg total) by mouth daily.     oxyCODONE-acetaminophen 5-325 MG per tablet  Commonly known as:  PERCOCET/ROXICET  Take 1-2 tablets by mouth every 4 (four) hours as needed for severe pain (moderate - severe pain).     prenatal multivitamin Tabs tablet  Take 1 tablet by mouth daily.            Follow-up Information   Follow up with  MODY,VAISHALI R, MD. Schedule an appointment as soon as possible for a visit in 6 weeks.   Specialty:  Obstetrics and Gynecology   Contact information:   [address] [place] [address] [phone]         Signed: Donette Larry, Dorris Carnes MSN, CNM 02/23/2014, 9:50 AM

## 2014-02-23 NOTE — Progress Notes (Signed)
POD # 2  Subjective: Pt reports feeling well, desires early discharge/ Pain controlled with Motrin and Percocet Tolerating po/Voiding without problems/ No n/v/ Flatus present Activity: ad lib Bleeding is light Newborn info:  Information for the patient's newborn:  Imanie, Darrow [[id number]]  female  / Circumcision: not planning/ Feeding: breast   Objective: VS: VS:  Filed Vitals:   02/22/14 1230 02/22/14 1636 02/22/14 1800 02/23/14 0609  BP: 95/61 96/58 110/60 102/63  Pulse: 60 66 64 55  Temp: 98.3 F (36.8 C) 98.4 F (36.9 C) 98.5 F (36.9 C) 98.4 F (36.9 C)  TempSrc: Oral Oral Oral Oral  Resp: 20 18 18 20   Height:      Weight:      SpO2: 100% 100%      I&O: Intake/Output     08/05 0701 - 08/06 0700 08/06 0701 - 08/07 0700   I.V. (mL/kg)     Total Intake(mL/kg)     Urine (mL/kg/hr) 1300 (0.7)    Blood     Total Output 1300     Net -1300            LABS:  Recent Labs  02/21/14 0936 02/22/14 0549  WBC 7.5 8.7  HGB 12.1 9.9*  PLT 188 144*                           Physical Exam:  General: alert and cooperative CV: Regular rate and rhythm Resp: CTA bilaterally Abdomen: soft, nontender, normal bowel sounds Incision: healing well, no drainage, no erythema, no hernia, no seroma, no swelling, well approximated, honeycomb dsg c/d/i Uterine Fundus: firm, below umbilicus, nontender Lochia: minimal Ext: extremities normal, atraumatic, no cyanosis or edema and Homans sign is negative, no sign of DVT    Assessment: POD # 2/ G2P2002/ S/P C/Section d/t repeat ABL anemia Doing well and stable for discharge home  Plan: Discharge home RX's: Ibuprofen 600mg  po Q 6 hrs prn pain #30 Refill x 1 Percocet 5/325 1 - 2 tabs po every 4 hrs prn pain #30 Refill x 0 Niferex 150mg  po BID #60 Refill x 1 [facility] booklet given    Signed: 04/24/14, , MSN, CNM 02/23/2014, 9:47 AM

## 2014-02-23 NOTE — Lactation Note (Signed)
This note was copied from the chart of Loretta Devri Minnie. Lactation Consultation Note   Follow up consult with this mom and baby, now 19 hours old, term, but under 6 ponds. i assisted mom with latching baby in football hold. I showed mom how to place pillows so that the baby is high enough, to have nipple to nose, to keep her hands behind baby's back, not head, and to wait for a wide mouth to bring baby in to her. The baby latched well, wide gaped mouth, and strong, rhythmic suckles. Mom appreciated the suggestions, and reports this latch much more comfortable. Mom knows to call for alctation o/p as needed.   Patient Name: Loretta Mendez NBVAP'O Date: 02/23/2014 Reason for consult: Follow-up assessment   Maternal Data    Feeding Feeding Type: Breast Fed Length of feed: 10 min  LATCH Score/Interventions Latch: Grasps breast easily, tongue down, lips flanged, rhythmical sucking. Intervention(s): Assist with latch;Adjust position  Audible Swallowing: A few with stimulation  Type of Nipple: Everted at rest and after stimulation  Comfort (Breast/Nipple): Soft / non-tender  Problem noted: Cracked, bleeding, blisters, bruises;Mild/Moderate discomfort Interventions  (Cracked/bleeding/bruising/blister): Expressed breast milk to nipple  Hold (Positioning): Assistance needed to correctly position infant at breast and maintain latch. Intervention(s): Breastfeeding basics reviewed;Support Pillows;Position options;Skin to skin  LATCH Score: 8  Lactation Tools Discussed/Used     Consult Status Consult Status: Complete Date: 02/23/14 Follow-up type: Call as needed    Alfred Levins 02/23/2014, 10:45 AM

## 2014-02-27 ENCOUNTER — Inpatient Hospital Stay (HOSPITAL_COMMUNITY): Admission: RE | Admit: 2014-02-27 | Payer: BC Managed Care – PPO | Source: Ambulatory Visit

## 2014-03-01 ENCOUNTER — Encounter (HOSPITAL_COMMUNITY): Payer: Self-pay | Admitting: *Deleted

## 2014-03-01 ENCOUNTER — Inpatient Hospital Stay (HOSPITAL_COMMUNITY)
Admission: RE | Admit: 2014-03-01 | Payer: BC Managed Care – PPO | Source: Ambulatory Visit | Admitting: Obstetrics & Gynecology

## 2014-03-01 ENCOUNTER — Encounter (HOSPITAL_COMMUNITY): Admission: RE | Payer: Self-pay | Source: Ambulatory Visit

## 2014-03-01 SURGERY — Surgical Case
Anesthesia: Spinal

## 2014-05-22 ENCOUNTER — Encounter (HOSPITAL_COMMUNITY): Payer: Self-pay | Admitting: *Deleted

## 2016-02-13 DIAGNOSIS — M0589 Other rheumatoid arthritis with rheumatoid factor of multiple sites: Secondary | ICD-10-CM | POA: Diagnosis not present

## 2016-02-13 DIAGNOSIS — Z79899 Other long term (current) drug therapy: Secondary | ICD-10-CM | POA: Diagnosis not present

## 2016-02-13 DIAGNOSIS — E559 Vitamin D deficiency, unspecified: Secondary | ICD-10-CM | POA: Diagnosis not present

## 2016-06-03 DIAGNOSIS — Z79899 Other long term (current) drug therapy: Secondary | ICD-10-CM | POA: Diagnosis not present

## 2016-06-03 DIAGNOSIS — M0589 Other rheumatoid arthritis with rheumatoid factor of multiple sites: Secondary | ICD-10-CM | POA: Diagnosis not present

## 2016-09-23 DIAGNOSIS — Z79899 Other long term (current) drug therapy: Secondary | ICD-10-CM | POA: Diagnosis not present

## 2016-09-23 DIAGNOSIS — M0589 Other rheumatoid arthritis with rheumatoid factor of multiple sites: Secondary | ICD-10-CM | POA: Diagnosis not present

## 2016-10-22 DIAGNOSIS — R768 Other specified abnormal immunological findings in serum: Secondary | ICD-10-CM | POA: Diagnosis not present

## 2016-10-22 DIAGNOSIS — B181 Chronic viral hepatitis B without delta-agent: Secondary | ICD-10-CM | POA: Diagnosis not present

## 2017-02-24 DIAGNOSIS — R5383 Other fatigue: Secondary | ICD-10-CM | POA: Diagnosis not present

## 2017-02-24 DIAGNOSIS — R768 Other specified abnormal immunological findings in serum: Secondary | ICD-10-CM | POA: Diagnosis not present

## 2017-02-24 DIAGNOSIS — Z79899 Other long term (current) drug therapy: Secondary | ICD-10-CM | POA: Diagnosis not present

## 2017-02-24 DIAGNOSIS — E559 Vitamin D deficiency, unspecified: Secondary | ICD-10-CM | POA: Diagnosis not present

## 2017-02-24 DIAGNOSIS — M0589 Other rheumatoid arthritis with rheumatoid factor of multiple sites: Secondary | ICD-10-CM | POA: Diagnosis not present

## 2017-04-08 ENCOUNTER — Ambulatory Visit: Payer: Self-pay | Admitting: Physician Assistant

## 2017-04-08 DIAGNOSIS — J209 Acute bronchitis, unspecified: Secondary | ICD-10-CM | POA: Diagnosis not present

## 2017-09-04 DIAGNOSIS — Z79899 Other long term (current) drug therapy: Secondary | ICD-10-CM | POA: Diagnosis not present

## 2017-09-04 DIAGNOSIS — M0589 Other rheumatoid arthritis with rheumatoid factor of multiple sites: Secondary | ICD-10-CM | POA: Diagnosis not present

## 2017-09-04 DIAGNOSIS — R768 Other specified abnormal immunological findings in serum: Secondary | ICD-10-CM | POA: Diagnosis not present

## 2017-09-04 DIAGNOSIS — E559 Vitamin D deficiency, unspecified: Secondary | ICD-10-CM | POA: Diagnosis not present

## 2017-09-04 DIAGNOSIS — R5383 Other fatigue: Secondary | ICD-10-CM | POA: Diagnosis not present

## 2017-09-25 DIAGNOSIS — R5383 Other fatigue: Secondary | ICD-10-CM | POA: Diagnosis not present

## 2017-09-25 DIAGNOSIS — Z7689 Persons encountering health services in other specified circumstances: Secondary | ICD-10-CM | POA: Diagnosis not present

## 2017-09-25 DIAGNOSIS — J329 Chronic sinusitis, unspecified: Secondary | ICD-10-CM | POA: Diagnosis not present

## 2017-09-25 DIAGNOSIS — J399 Disease of upper respiratory tract, unspecified: Secondary | ICD-10-CM | POA: Diagnosis not present

## 2017-11-09 DIAGNOSIS — Z0000 Encounter for general adult medical examination without abnormal findings: Secondary | ICD-10-CM | POA: Diagnosis not present

## 2017-11-09 DIAGNOSIS — R739 Hyperglycemia, unspecified: Secondary | ICD-10-CM | POA: Diagnosis not present

## 2017-11-09 DIAGNOSIS — R5383 Other fatigue: Secondary | ICD-10-CM | POA: Diagnosis not present

## 2017-12-04 DIAGNOSIS — Z6822 Body mass index (BMI) 22.0-22.9, adult: Secondary | ICD-10-CM | POA: Diagnosis not present

## 2017-12-04 DIAGNOSIS — Z01419 Encounter for gynecological examination (general) (routine) without abnormal findings: Secondary | ICD-10-CM | POA: Diagnosis not present

## 2017-12-04 DIAGNOSIS — Z1151 Encounter for screening for human papillomavirus (HPV): Secondary | ICD-10-CM | POA: Diagnosis not present

## 2018-07-05 DIAGNOSIS — R768 Other specified abnormal immunological findings in serum: Secondary | ICD-10-CM | POA: Diagnosis not present

## 2018-07-05 DIAGNOSIS — M79641 Pain in right hand: Secondary | ICD-10-CM | POA: Diagnosis not present

## 2018-07-05 DIAGNOSIS — R5383 Other fatigue: Secondary | ICD-10-CM | POA: Diagnosis not present

## 2018-07-05 DIAGNOSIS — S6991XA Unspecified injury of right wrist, hand and finger(s), initial encounter: Secondary | ICD-10-CM | POA: Diagnosis not present

## 2018-07-05 DIAGNOSIS — Z79899 Other long term (current) drug therapy: Secondary | ICD-10-CM | POA: Diagnosis not present

## 2018-07-05 DIAGNOSIS — S62632A Displaced fracture of distal phalanx of right middle finger, initial encounter for closed fracture: Secondary | ICD-10-CM | POA: Diagnosis not present

## 2018-07-05 DIAGNOSIS — M0589 Other rheumatoid arthritis with rheumatoid factor of multiple sites: Secondary | ICD-10-CM | POA: Diagnosis not present

## 2018-07-05 DIAGNOSIS — M79642 Pain in left hand: Secondary | ICD-10-CM | POA: Diagnosis not present

## 2018-07-06 DIAGNOSIS — S62635A Displaced fracture of distal phalanx of left ring finger, initial encounter for closed fracture: Secondary | ICD-10-CM | POA: Diagnosis not present

## 2018-07-30 DIAGNOSIS — S62635D Displaced fracture of distal phalanx of left ring finger, subsequent encounter for fracture with routine healing: Secondary | ICD-10-CM | POA: Diagnosis not present

## 2018-08-24 DIAGNOSIS — S62635A Displaced fracture of distal phalanx of left ring finger, initial encounter for closed fracture: Secondary | ICD-10-CM | POA: Diagnosis not present

## 2018-08-27 DIAGNOSIS — S62639A Displaced fracture of distal phalanx of unspecified finger, initial encounter for closed fracture: Secondary | ICD-10-CM | POA: Diagnosis not present

## 2018-09-10 DIAGNOSIS — S62639A Displaced fracture of distal phalanx of unspecified finger, initial encounter for closed fracture: Secondary | ICD-10-CM | POA: Diagnosis not present

## 2018-09-17 DIAGNOSIS — S62639A Displaced fracture of distal phalanx of unspecified finger, initial encounter for closed fracture: Secondary | ICD-10-CM | POA: Diagnosis not present

## 2018-09-27 DIAGNOSIS — H16102 Unspecified superficial keratitis, left eye: Secondary | ICD-10-CM | POA: Diagnosis not present

## 2018-09-28 DIAGNOSIS — S62635A Displaced fracture of distal phalanx of left ring finger, initial encounter for closed fracture: Secondary | ICD-10-CM | POA: Diagnosis not present

## 2018-11-11 DIAGNOSIS — S62635A Displaced fracture of distal phalanx of left ring finger, initial encounter for closed fracture: Secondary | ICD-10-CM | POA: Diagnosis not present

## 2019-02-04 DIAGNOSIS — R768 Other specified abnormal immunological findings in serum: Secondary | ICD-10-CM | POA: Diagnosis not present

## 2019-02-04 DIAGNOSIS — Z79899 Other long term (current) drug therapy: Secondary | ICD-10-CM | POA: Diagnosis not present

## 2019-02-04 DIAGNOSIS — M0589 Other rheumatoid arthritis with rheumatoid factor of multiple sites: Secondary | ICD-10-CM | POA: Diagnosis not present

## 2019-02-04 DIAGNOSIS — R5383 Other fatigue: Secondary | ICD-10-CM | POA: Diagnosis not present

## 2019-02-16 DIAGNOSIS — M0589 Other rheumatoid arthritis with rheumatoid factor of multiple sites: Secondary | ICD-10-CM | POA: Diagnosis not present

## 2019-02-16 DIAGNOSIS — Z79899 Other long term (current) drug therapy: Secondary | ICD-10-CM | POA: Diagnosis not present

## 2019-08-04 DIAGNOSIS — Z20828 Contact with and (suspected) exposure to other viral communicable diseases: Secondary | ICD-10-CM | POA: Diagnosis not present

## 2019-08-05 DIAGNOSIS — E559 Vitamin D deficiency, unspecified: Secondary | ICD-10-CM | POA: Diagnosis not present

## 2019-08-05 DIAGNOSIS — R768 Other specified abnormal immunological findings in serum: Secondary | ICD-10-CM | POA: Diagnosis not present

## 2019-08-05 DIAGNOSIS — R5383 Other fatigue: Secondary | ICD-10-CM | POA: Diagnosis not present

## 2019-08-05 DIAGNOSIS — Z79899 Other long term (current) drug therapy: Secondary | ICD-10-CM | POA: Diagnosis not present

## 2019-08-05 DIAGNOSIS — M0589 Other rheumatoid arthritis with rheumatoid factor of multiple sites: Secondary | ICD-10-CM | POA: Diagnosis not present

## 2020-02-10 DIAGNOSIS — Z79899 Other long term (current) drug therapy: Secondary | ICD-10-CM | POA: Diagnosis not present

## 2020-02-10 DIAGNOSIS — R5383 Other fatigue: Secondary | ICD-10-CM | POA: Diagnosis not present

## 2020-02-10 DIAGNOSIS — M0589 Other rheumatoid arthritis with rheumatoid factor of multiple sites: Secondary | ICD-10-CM | POA: Diagnosis not present

## 2020-02-10 DIAGNOSIS — R768 Other specified abnormal immunological findings in serum: Secondary | ICD-10-CM | POA: Diagnosis not present

## 2020-02-10 DIAGNOSIS — E559 Vitamin D deficiency, unspecified: Secondary | ICD-10-CM | POA: Diagnosis not present

## 2020-08-30 DIAGNOSIS — M791 Myalgia, unspecified site: Secondary | ICD-10-CM | POA: Diagnosis not present

## 2020-08-30 DIAGNOSIS — M0589 Other rheumatoid arthritis with rheumatoid factor of multiple sites: Secondary | ICD-10-CM | POA: Diagnosis not present

## 2020-08-30 DIAGNOSIS — R768 Other specified abnormal immunological findings in serum: Secondary | ICD-10-CM | POA: Diagnosis not present

## 2020-08-30 DIAGNOSIS — Z79899 Other long term (current) drug therapy: Secondary | ICD-10-CM | POA: Diagnosis not present

## 2020-08-30 DIAGNOSIS — R5383 Other fatigue: Secondary | ICD-10-CM | POA: Diagnosis not present

## 2021-02-26 DIAGNOSIS — R5383 Other fatigue: Secondary | ICD-10-CM | POA: Diagnosis not present

## 2021-02-26 DIAGNOSIS — Z0000 Encounter for general adult medical examination without abnormal findings: Secondary | ICD-10-CM | POA: Diagnosis not present

## 2021-02-26 DIAGNOSIS — Z0184 Encounter for antibody response examination: Secondary | ICD-10-CM | POA: Diagnosis not present

## 2021-02-26 DIAGNOSIS — E559 Vitamin D deficiency, unspecified: Secondary | ICD-10-CM | POA: Diagnosis not present

## 2021-02-26 DIAGNOSIS — R739 Hyperglycemia, unspecified: Secondary | ICD-10-CM | POA: Diagnosis not present

## 2021-03-05 DIAGNOSIS — G8929 Other chronic pain: Secondary | ICD-10-CM | POA: Diagnosis not present

## 2021-03-05 DIAGNOSIS — M0589 Other rheumatoid arthritis with rheumatoid factor of multiple sites: Secondary | ICD-10-CM | POA: Diagnosis not present

## 2021-03-05 DIAGNOSIS — Z0000 Encounter for general adult medical examination without abnormal findings: Secondary | ICD-10-CM | POA: Diagnosis not present

## 2021-03-05 DIAGNOSIS — R7303 Prediabetes: Secondary | ICD-10-CM | POA: Diagnosis not present

## 2021-03-05 DIAGNOSIS — E559 Vitamin D deficiency, unspecified: Secondary | ICD-10-CM | POA: Diagnosis not present

## 2021-04-15 DIAGNOSIS — M0589 Other rheumatoid arthritis with rheumatoid factor of multiple sites: Secondary | ICD-10-CM | POA: Diagnosis not present

## 2021-04-15 DIAGNOSIS — M791 Myalgia, unspecified site: Secondary | ICD-10-CM | POA: Diagnosis not present

## 2021-04-15 DIAGNOSIS — Z79899 Other long term (current) drug therapy: Secondary | ICD-10-CM | POA: Diagnosis not present

## 2021-04-15 DIAGNOSIS — R768 Other specified abnormal immunological findings in serum: Secondary | ICD-10-CM | POA: Diagnosis not present

## 2021-07-24 DIAGNOSIS — S91121A Laceration with foreign body of right great toe without damage to nail, initial encounter: Secondary | ICD-10-CM | POA: Diagnosis not present

## 2021-10-18 DIAGNOSIS — M0589 Other rheumatoid arthritis with rheumatoid factor of multiple sites: Secondary | ICD-10-CM | POA: Diagnosis not present

## 2021-10-18 DIAGNOSIS — E559 Vitamin D deficiency, unspecified: Secondary | ICD-10-CM | POA: Diagnosis not present

## 2021-10-18 DIAGNOSIS — Z79899 Other long term (current) drug therapy: Secondary | ICD-10-CM | POA: Diagnosis not present

## 2021-10-18 DIAGNOSIS — R768 Other specified abnormal immunological findings in serum: Secondary | ICD-10-CM | POA: Diagnosis not present

## 2021-10-18 DIAGNOSIS — M791 Myalgia, unspecified site: Secondary | ICD-10-CM | POA: Diagnosis not present

## 2022-01-01 DIAGNOSIS — Z713 Dietary counseling and surveillance: Secondary | ICD-10-CM | POA: Diagnosis not present

## 2022-01-29 DIAGNOSIS — Z713 Dietary counseling and surveillance: Secondary | ICD-10-CM | POA: Diagnosis not present

## 2022-02-06 DIAGNOSIS — Z713 Dietary counseling and surveillance: Secondary | ICD-10-CM | POA: Diagnosis not present

## 2022-03-04 DIAGNOSIS — E559 Vitamin D deficiency, unspecified: Secondary | ICD-10-CM | POA: Diagnosis not present

## 2022-03-04 DIAGNOSIS — R5383 Other fatigue: Secondary | ICD-10-CM | POA: Diagnosis not present

## 2022-03-04 DIAGNOSIS — M791 Myalgia, unspecified site: Secondary | ICD-10-CM | POA: Diagnosis not present

## 2022-03-04 DIAGNOSIS — R7303 Prediabetes: Secondary | ICD-10-CM | POA: Diagnosis not present

## 2022-03-04 DIAGNOSIS — Z0000 Encounter for general adult medical examination without abnormal findings: Secondary | ICD-10-CM | POA: Diagnosis not present

## 2022-03-12 DIAGNOSIS — E559 Vitamin D deficiency, unspecified: Secondary | ICD-10-CM | POA: Diagnosis not present

## 2022-03-12 DIAGNOSIS — R7303 Prediabetes: Secondary | ICD-10-CM | POA: Diagnosis not present

## 2022-03-12 DIAGNOSIS — Z0000 Encounter for general adult medical examination without abnormal findings: Secondary | ICD-10-CM | POA: Diagnosis not present

## 2022-03-12 DIAGNOSIS — M545 Low back pain, unspecified: Secondary | ICD-10-CM | POA: Diagnosis not present

## 2022-03-12 DIAGNOSIS — Z713 Dietary counseling and surveillance: Secondary | ICD-10-CM | POA: Diagnosis not present

## 2022-04-28 DIAGNOSIS — M25521 Pain in right elbow: Secondary | ICD-10-CM | POA: Diagnosis not present

## 2022-04-28 DIAGNOSIS — M25531 Pain in right wrist: Secondary | ICD-10-CM | POA: Diagnosis not present

## 2022-04-28 DIAGNOSIS — S52134A Nondisplaced fracture of neck of right radius, initial encounter for closed fracture: Secondary | ICD-10-CM | POA: Diagnosis not present

## 2022-04-28 DIAGNOSIS — M25421 Effusion, right elbow: Secondary | ICD-10-CM | POA: Diagnosis not present

## 2022-04-29 DIAGNOSIS — M25521 Pain in right elbow: Secondary | ICD-10-CM | POA: Diagnosis not present

## 2022-04-29 DIAGNOSIS — S52134A Nondisplaced fracture of neck of right radius, initial encounter for closed fracture: Secondary | ICD-10-CM | POA: Diagnosis not present

## 2022-05-05 DIAGNOSIS — M25521 Pain in right elbow: Secondary | ICD-10-CM | POA: Diagnosis not present

## 2022-05-20 DIAGNOSIS — S52124A Nondisplaced fracture of head of right radius, initial encounter for closed fracture: Secondary | ICD-10-CM | POA: Diagnosis not present

## 2022-07-01 DIAGNOSIS — S52124A Nondisplaced fracture of head of right radius, initial encounter for closed fracture: Secondary | ICD-10-CM | POA: Diagnosis not present

## 2022-08-26 DIAGNOSIS — R768 Other specified abnormal immunological findings in serum: Secondary | ICD-10-CM | POA: Diagnosis not present

## 2022-08-26 DIAGNOSIS — E559 Vitamin D deficiency, unspecified: Secondary | ICD-10-CM | POA: Diagnosis not present

## 2022-08-26 DIAGNOSIS — M0589 Other rheumatoid arthritis with rheumatoid factor of multiple sites: Secondary | ICD-10-CM | POA: Diagnosis not present

## 2022-08-26 DIAGNOSIS — M791 Myalgia, unspecified site: Secondary | ICD-10-CM | POA: Diagnosis not present

## 2022-08-26 DIAGNOSIS — Z79899 Other long term (current) drug therapy: Secondary | ICD-10-CM | POA: Diagnosis not present

## 2023-03-11 DIAGNOSIS — Z79899 Other long term (current) drug therapy: Secondary | ICD-10-CM | POA: Diagnosis not present

## 2023-03-11 DIAGNOSIS — R768 Other specified abnormal immunological findings in serum: Secondary | ICD-10-CM | POA: Diagnosis not present

## 2023-03-11 DIAGNOSIS — M0589 Other rheumatoid arthritis with rheumatoid factor of multiple sites: Secondary | ICD-10-CM | POA: Diagnosis not present

## 2023-03-11 DIAGNOSIS — R7303 Prediabetes: Secondary | ICD-10-CM | POA: Diagnosis not present

## 2023-03-11 DIAGNOSIS — M791 Myalgia, unspecified site: Secondary | ICD-10-CM | POA: Diagnosis not present

## 2023-03-27 DIAGNOSIS — Z0000 Encounter for general adult medical examination without abnormal findings: Secondary | ICD-10-CM | POA: Diagnosis not present

## 2023-03-27 DIAGNOSIS — M545 Low back pain, unspecified: Secondary | ICD-10-CM | POA: Diagnosis not present

## 2023-03-27 DIAGNOSIS — E559 Vitamin D deficiency, unspecified: Secondary | ICD-10-CM | POA: Diagnosis not present

## 2023-03-27 DIAGNOSIS — R7303 Prediabetes: Secondary | ICD-10-CM | POA: Diagnosis not present

## 2023-09-11 DIAGNOSIS — E559 Vitamin D deficiency, unspecified: Secondary | ICD-10-CM | POA: Diagnosis not present

## 2023-09-11 DIAGNOSIS — R768 Other specified abnormal immunological findings in serum: Secondary | ICD-10-CM | POA: Diagnosis not present

## 2023-09-11 DIAGNOSIS — M0589 Other rheumatoid arthritis with rheumatoid factor of multiple sites: Secondary | ICD-10-CM | POA: Diagnosis not present

## 2023-09-11 DIAGNOSIS — Z79899 Other long term (current) drug therapy: Secondary | ICD-10-CM | POA: Diagnosis not present

## 2023-09-11 DIAGNOSIS — M791 Myalgia, unspecified site: Secondary | ICD-10-CM | POA: Diagnosis not present

## 2023-09-11 DIAGNOSIS — R5383 Other fatigue: Secondary | ICD-10-CM | POA: Diagnosis not present

## 2024-03-25 DIAGNOSIS — Z0000 Encounter for general adult medical examination without abnormal findings: Secondary | ICD-10-CM | POA: Diagnosis not present

## 2024-03-25 DIAGNOSIS — D508 Other iron deficiency anemias: Secondary | ICD-10-CM | POA: Diagnosis not present

## 2024-03-25 DIAGNOSIS — E559 Vitamin D deficiency, unspecified: Secondary | ICD-10-CM | POA: Diagnosis not present

## 2024-03-25 DIAGNOSIS — R7303 Prediabetes: Secondary | ICD-10-CM | POA: Diagnosis not present

## 2024-03-25 DIAGNOSIS — R5383 Other fatigue: Secondary | ICD-10-CM | POA: Diagnosis not present

## 2024-04-01 ENCOUNTER — Other Ambulatory Visit (HOSPITAL_BASED_OUTPATIENT_CLINIC_OR_DEPARTMENT_OTHER): Payer: Self-pay

## 2024-04-01 DIAGNOSIS — E559 Vitamin D deficiency, unspecified: Secondary | ICD-10-CM | POA: Diagnosis not present

## 2024-04-01 DIAGNOSIS — M791 Myalgia, unspecified site: Secondary | ICD-10-CM | POA: Diagnosis not present

## 2024-04-01 DIAGNOSIS — R6889 Other general symptoms and signs: Secondary | ICD-10-CM

## 2024-04-01 DIAGNOSIS — E538 Deficiency of other specified B group vitamins: Secondary | ICD-10-CM | POA: Diagnosis not present

## 2024-04-01 DIAGNOSIS — Z0000 Encounter for general adult medical examination without abnormal findings: Secondary | ICD-10-CM | POA: Diagnosis not present

## 2024-04-01 DIAGNOSIS — R7303 Prediabetes: Secondary | ICD-10-CM | POA: Diagnosis not present

## 2024-04-01 DIAGNOSIS — G8929 Other chronic pain: Secondary | ICD-10-CM | POA: Diagnosis not present

## 2024-04-01 DIAGNOSIS — R5383 Other fatigue: Secondary | ICD-10-CM

## 2024-04-01 DIAGNOSIS — Z8249 Family history of ischemic heart disease and other diseases of the circulatory system: Secondary | ICD-10-CM

## 2024-04-11 DIAGNOSIS — Z79899 Other long term (current) drug therapy: Secondary | ICD-10-CM | POA: Diagnosis not present

## 2024-04-11 DIAGNOSIS — M0589 Other rheumatoid arthritis with rheumatoid factor of multiple sites: Secondary | ICD-10-CM | POA: Diagnosis not present

## 2024-04-11 DIAGNOSIS — R5383 Other fatigue: Secondary | ICD-10-CM | POA: Diagnosis not present

## 2024-04-11 DIAGNOSIS — R768 Other specified abnormal immunological findings in serum: Secondary | ICD-10-CM | POA: Diagnosis not present

## 2024-04-11 DIAGNOSIS — M791 Myalgia, unspecified site: Secondary | ICD-10-CM | POA: Diagnosis not present
